# Patient Record
Sex: Female | Born: 1975 | Race: White | Hispanic: No | Smoking: Current every day smoker
Health system: Southern US, Community
[De-identification: ages and names within clinical notes are randomized; demographics above are authoritative.]

## PROBLEM LIST (undated history)

## (undated) DIAGNOSIS — G8929 Other chronic pain: Secondary | ICD-10-CM

## (undated) DIAGNOSIS — G43909 Migraine, unspecified, not intractable, without status migrainosus: Secondary | ICD-10-CM

## (undated) DIAGNOSIS — J45909 Unspecified asthma, uncomplicated: Secondary | ICD-10-CM

## (undated) DIAGNOSIS — E039 Hypothyroidism, unspecified: Secondary | ICD-10-CM

## (undated) DIAGNOSIS — M549 Dorsalgia, unspecified: Secondary | ICD-10-CM

## (undated) HISTORY — DX: Hypothyroidism, unspecified: E03.9

## (undated) HISTORY — PX: TUBAL LIGATION: SHX77

---

## 2010-11-21 ENCOUNTER — Inpatient Hospital Stay (INDEPENDENT_AMBULATORY_CARE_PROVIDER_SITE_OTHER)
Admission: RE | Admit: 2010-11-21 | Discharge: 2010-11-21 | Disposition: A | Discharge status: Home / Self Care | Payer: Self-pay | Source: Ambulatory Visit | Attending: Family Medicine | Admitting: Family Medicine

## 2010-11-21 DIAGNOSIS — N760 Acute vaginitis: Secondary | ICD-10-CM

## 2010-11-21 DIAGNOSIS — A499 Bacterial infection, unspecified: Secondary | ICD-10-CM

## 2010-11-21 DIAGNOSIS — J45909 Unspecified asthma, uncomplicated: Secondary | ICD-10-CM

## 2010-11-21 LAB — POCT PREGNANCY, URINE: Preg Test, Ur: NEGATIVE

## 2010-11-21 LAB — POCT URINALYSIS DIP (DEVICE)
Hgb urine dipstick: NEGATIVE
Protein, ur: NEGATIVE mg/dL
Specific Gravity, Urine: 1.015 (ref 1.005–1.030)
Urobilinogen, UA: 0.2 mg/dL (ref 0.0–1.0)

## 2010-11-21 LAB — WET PREP, GENITAL

## 2010-11-22 LAB — GC/CHLAMYDIA PROBE AMP, GENITAL
Chlamydia, DNA Probe: NEGATIVE
GC Probe Amp, Genital: NEGATIVE

## 2013-07-10 ENCOUNTER — Emergency Department (HOSPITAL_COMMUNITY)
Admission: EM | Admit: 2013-07-10 | Discharge: 2013-07-10 | Disposition: A | Discharge status: Home / Self Care | Payer: Medicaid Other | Source: Home / Self Care

## 2013-07-10 ENCOUNTER — Encounter (HOSPITAL_COMMUNITY): Payer: Self-pay | Admitting: Emergency Medicine

## 2013-07-10 DIAGNOSIS — R52 Pain, unspecified: Secondary | ICD-10-CM

## 2013-07-10 DIAGNOSIS — S46911A Strain of unspecified muscle, fascia and tendon at shoulder and upper arm level, right arm, initial encounter: Secondary | ICD-10-CM

## 2013-07-10 DIAGNOSIS — M546 Pain in thoracic spine: Secondary | ICD-10-CM

## 2013-07-10 DIAGNOSIS — M545 Low back pain, unspecified: Secondary | ICD-10-CM

## 2013-07-10 DIAGNOSIS — G8929 Other chronic pain: Secondary | ICD-10-CM

## 2013-07-10 DIAGNOSIS — IMO0002 Reserved for concepts with insufficient information to code with codable children: Secondary | ICD-10-CM

## 2013-07-10 HISTORY — DX: Unspecified asthma, uncomplicated: J45.909

## 2013-07-10 HISTORY — DX: Migraine, unspecified, not intractable, without status migrainosus: G43.909

## 2013-07-10 MED ORDER — CYCLOBENZAPRINE HCL 5 MG PO TABS
5.0000 mg | ORAL_TABLET | Freq: Three times a day (TID) | ORAL | Status: DC | PRN
Start: 2013-07-10 — End: 2017-09-24

## 2013-07-10 MED ORDER — TRAMADOL HCL 50 MG PO TABS
50.0000 mg | ORAL_TABLET | Freq: Four times a day (QID) | ORAL | Status: DC | PRN
Start: 2013-07-10 — End: 2017-09-24

## 2013-07-10 MED ORDER — DICLOFENAC POTASSIUM 50 MG PO TABS: 50.0000 mg | ORAL_TABLET | Freq: Three times a day (TID) | ORAL | Status: DC

## 2013-07-10 NOTE — ED Provider Notes (Signed)
Medical screening examination/treatment/procedure(s) were performed by resident physician or non-physician practitioner and as supervising physician I was immediately available for consultation/collaboration.   Tyren Dugar DOUGLAS MD.   Tricia Pledger D Reeve Mallo, MD 07/10/13 1314 

## 2013-07-10 NOTE — ED Provider Notes (Signed)
CSN: [id number]     Arrival date & time 07/10/13  0818 History   First MD Initiated Contact with Patient 07/10/13 [phone]     Chief Complaint  Patient presents with  . Back Pain   (Consider location/radiation/quality/duration/timing/severity/associated sxs/prior Treatment) HPI Comments: 38 year old female complaining of acute on chronic back pain. States she has had low back pain since 38 years old. Apparently she had a type of injury to her spine early in life and was placed on narcotics for a period of time. She has not seen a physician for several years. She states in the past 3-4 days she twisted "wrong" and caused pain in the right parathoracic musculature along the medial scapula. She is also complaining of worsening pain along the lower lumbosacral musculature. She states rarely the pain will radiate down the right leg. The case is chronic as well. Denies focal motor weakness or sensory loss.   Past Medical History  Diagnosis Date  . Asthma   . Migraine headache    Past Surgical History  Procedure Laterality Date  . Tubal ligation     History reviewed. No pertinent family history. History  Substance Use Topics  . Smoking status: Current Every Day Smoker  . Smokeless tobacco: Not on file  . Alcohol Use: No   OB History   Grav Para Term Preterm Abortions TAB SAB Ect Mult Living                 Review of Systems  Constitutional: Positive for activity change. Negative for fever and chills.  HENT: Negative.   Respiratory: Negative.   Cardiovascular: Negative.   Musculoskeletal: Positive for back pain and myalgias.       As per HPI  Skin: Negative for color change, pallor and rash.  Neurological: Negative.     Allergies  Review of patient's allergies indicates no known allergies.  Home Medications   Current Outpatient Rx  Name  Route  Sig  Dispense  Refill  . amoxicillin (AMOXIL) 500 MG capsule   Oral   Take 500 mg by mouth 3 (three) times daily.         .  cyclobenzaprine (FLEXERIL) 5 MG tablet   Oral   Take 1 tablet (5 mg total) by mouth 3 (three) times daily as needed for muscle spasms.   20 tablet   0   . diclofenac (CATAFLAM) 50 MG tablet   Oral   Take 1 tablet (50 mg total) by mouth 3 (three) times daily.   21 tablet   0   . traMADol (ULTRAM) 50 MG tablet   Oral   Take 1 tablet (50 mg total) by mouth every 6 (six) hours as needed.   20 tablet   0    BP 124/84  Pulse 72  Temp(Src) 98.7 F (37.1 C) (Oral)  Resp 18  SpO2 98% Physical Exam  Nursing note and vitals reviewed. Constitutional: She is oriented to person, place, and time. She appears well-developed and well-nourished. No distress.  HENT:  Head: Normocephalic and atraumatic.  Eyes: EOM are normal. Pupils are equal, round, and reactive to light.  Neck: Normal range of motion. Neck supple.  Pulmonary/Chest: Effort normal. No respiratory distress.  Musculoskeletal: She exhibits tenderness. She exhibits no edema.  Tenderness without swelling or deformity to the right parathoracic/medial scapular musculature. Result of tenderness along the lumbosacral stretcher. Again, no swelling, discoloration or deformity. Distal neurovascular motor sensory is intact. Normal gait.  Lymphadenopathy:    She has  no cervical adenopathy.  Neurological: She is alert and oriented to person, place, and time. No cranial nerve deficit. She exhibits normal muscle tone.  Skin: Skin is warm and dry.  Psychiatric: She has a normal mood and affect.    ED Course  Procedures (including critical care time) Labs Review Labs Reviewed - No data to display Imaging Review No results found.    MDM   1. Acute exacerbation of chronic low back pain   2. Right-sided thoracic back pain   3. Muscle strain of right scapular region    Tramdol 50 mg Flexeril 5 mg Cataflam 50 MG  Ice to sore areas (St heat always makes it worse) Follow with your PCP ASAP     Hayden Rasmussen, NP 07/10/13 [phone]

## 2013-07-10 NOTE — ED Notes (Addendum)
Reported history of fractures in back many years ago w residual chronic pain . C/o flare up of pain since mid week, minimal relief w motrin and flexeril. On amoxicillin for reported dental issue

## 2013-07-10 NOTE — Discharge Instructions (Signed)
Back Pain, Adult Low back pain is very common. About 1 in 5 people have back pain.The cause of low back pain is rarely dangerous. The pain often gets better over time.About half of people with a sudden onset of back pain feel better in just 2 weeks. About 8 in 10 people feel better by 6 weeks.  CAUSES Some common causes of back pain include:  Strain of the muscles or ligaments supporting the spine.  Wear and tear (degeneration) of the spinal discs.  Arthritis.  Direct injury to the back. DIAGNOSIS Most of the time, the direct cause of low back pain is not known.However, back pain can be treated effectively even when the exact cause of the pain is unknown.Answering your caregiver's questions about your overall health and symptoms is one of the most accurate ways to make sure the cause of your pain is not dangerous. If your caregiver needs more information, he or she may order lab work or imaging tests (X-rays or MRIs).However, even if imaging tests show changes in your back, this usually does not require surgery. HOME CARE INSTRUCTIONS For many people, back pain returns.Since low back pain is rarely dangerous, it is often a condition that people can learn to [facility] their own.   Remain active. It is stressful on the back to sit or stand in one place. Do not sit, drive, or stand in one place for more than 30 minutes at a time. Take short walks on level surfaces as soon as pain allows.Try to increase the length of time you walk each day.  Do not stay in bed.Resting more than 1 or 2 days can delay your recovery.  Do not avoid exercise or work.Your body is made to move.It is not dangerous to be active, even though your back may hurt.Your back will likely heal faster if you return to being active before your pain is gone.  Pay attention to your body when you bend and lift. Many people have less discomfortwhen lifting if they bend their knees, keep the load close to their bodies,and  avoid twisting. Often, the most comfortable positions are those that put less stress on your recovering back.  Find a comfortable position to sleep. Use a firm mattress and lie on your side with your knees slightly bent. If you lie on your back, put a pillow under your knees.  Only take over-the-counter or prescription medicines as directed by your caregiver. Over-the-counter medicines to reduce pain and inflammation are often the most helpful.Your caregiver may prescribe muscle relaxant drugs.These medicines help dull your pain so you can more quickly return to your normal activities and healthy exercise.  Put ice on the injured area.  Put ice in a plastic bag.  Place a towel between your skin and the bag.  Leave the ice on for 15-20 minutes, 03-04 times a day for the first 2 to 3 days. After that, ice and heat may be alternated to reduce pain and spasms.  Ask your caregiver about trying back exercises and gentle massage. This may be of some benefit.  Avoid feeling anxious or stressed.Stress increases muscle tension and can worsen back pain.It is important to recognize when you are anxious or stressed and learn ways to manage it.Exercise is a great option. SEEK MEDICAL CARE IF:  You have pain that is not relieved with rest or medicine.  You have pain that does not improve in 1 week.  You have new symptoms.  You are generally not feeling well. SEEK  IMMEDIATE MEDICAL CARE IF:   You have pain that radiates from your back into your legs.  You develop new bowel or bladder control problems.  You have unusual weakness or numbness in your arms or legs.  You develop nausea or vomiting.  You develop abdominal pain.  You feel faint. Document Released: 06/04/2005 Document Revised: 12/04/2011 Document Reviewed: 10/23/2010 [facility] Patient Information 2014 [place], [place].  Chronic Back Pain  When back pain lasts longer than 3 months, it is called chronic back pain.People with  chronic back pain often go through certain periods that are more intense (flare-ups).  CAUSES Chronic back pain can be caused by wear and tear (degeneration) on different structures in your back. These structures include:  The bones of your spine (vertebrae) and the joints surrounding your spinal cord and nerve roots (facets).  The strong, fibrous tissues that connect your vertebrae (ligaments). Degeneration of these structures may result in pressure on your nerves. This can lead to constant pain. HOME CARE INSTRUCTIONS  Avoid bending, heavy lifting, prolonged sitting, and activities which make the problem worse.  Take brief periods of rest throughout the day to reduce your pain. Lying down or standing usually is better than sitting while you are resting.  Take over-the-counter or prescription medicines only as directed by your caregiver. SEEK IMMEDIATE MEDICAL CARE IF:   You have weakness or numbness in one of your legs or feet.  You have trouble controlling your bladder or bowels.  You have nausea, vomiting, abdominal pain, shortness of breath, or fainting. Document Released: 07/12/2004 Document Revised: 08/27/2011 Document Reviewed: 05/19/2011 [facility] Patient Information 2014 [place], [place].  Lumbosacral Strain Lumbosacral strain is a strain of any of the parts that make up your lumbosacral vertebrae. Your lumbosacral vertebrae are the bones that make up the lower third of your backbone. Your lumbosacral vertebrae are held together by muscles and tough, fibrous tissue (ligaments).  CAUSES  A sudden blow to your back can cause lumbosacral strain. Also, anything that causes an excessive stretch of the muscles in the low back can cause this strain. This is typically seen when people exert themselves strenuously, fall, lift heavy objects, bend, or crouch repeatedly. RISK FACTORS  Physically demanding work.  Participation in pushing or pulling sports or sports that require sudden  twist of the back (tennis, golf, baseball).  Weight lifting.  Excessive lower back curvature.  Forward-tilted pelvis.  Weak back or abdominal muscles or both.  Tight hamstrings. SIGNS AND SYMPTOMS  Lumbosacral strain may cause pain in the area of your injury or pain that moves (radiates) down your leg.  DIAGNOSIS Your health care provider can often diagnose lumbosacral strain through a physical exam. In some cases, you may need tests such as X-ray exams.  TREATMENT  Treatment for your lower back injury depends on many factors that your clinician will have to evaluate. However, most treatment will include the use of anti-inflammatory medicines. HOME CARE INSTRUCTIONS   Avoid hard physical activities (tennis, racquetball, waterskiing) if you are not in proper physical condition for it. This may aggravate or create problems.  If you have a back problem, avoid sports requiring sudden body movements. Swimming and walking are generally safer activities.  Maintain good posture.  Maintain a healthy weight.  For acute conditions, you may put ice on the injured area.  Put ice in a plastic bag.  Place a towel between your skin and the bag.  Leave the ice on for 20 minutes, 2 3 times a day.  When the low back starts healing, stretching and strengthening exercises may be recommended. SEEK MEDICAL CARE IF:  Your back pain is getting worse.  You experience severe back pain not relieved with medicines. SEEK IMMEDIATE MEDICAL CARE IF:   You have numbness, tingling, weakness, or problems with the use of your arms or legs.  There is a change in bowel or bladder control.  You have increasing pain in any area of the body, including your belly (abdomen).  You notice shortness of breath, dizziness, or feel faint.  You feel sick to your stomach (nauseous), are throwing up (vomiting), or become sweaty.  You notice discoloration of your toes or legs, or your feet get very cold. MAKE SURE  YOU:   Understand these instructions.  Will watch your condition.  Will get help right away if you are not doing well or get worse. Document Released: 03/14/2005 Document Revised: 03/25/2013 Document Reviewed: 01/21/2013 [facility] Patient Information 2014 [place], [place].  Muscle Strain A muscle strain is an injury that occurs when a muscle is stretched beyond its normal length. Usually a small number of muscle fibers are torn when this happens. Muscle strain is rated in degrees. First-degree strains have the least amount of muscle fiber tearing and pain. Second-degree and third-degree strains have increasingly more tearing and pain.  Usually, recovery from muscle strain takes 1 2 weeks. Complete healing takes 5 6 weeks.  CAUSES  Muscle strain happens when a sudden, violent force placed on a muscle stretches it too far. This may occur with lifting, sports, or a fall.  RISK FACTORS Muscle strain is especially common in athletes.  SIGNS AND SYMPTOMS At the site of the muscle strain, there may be:  Pain.  Bruising.  Swelling.  Difficulty using the muscle due to pain or lack of normal function. DIAGNOSIS  Your health care provider will perform a physical exam and ask about your medical history. TREATMENT  Often, the best treatment for a muscle strain is resting, icing, and applying cold compresses to the injured area.  HOME CARE INSTRUCTIONS   Use the PRICE method of treatment to promote muscle healing during the first 2 3 days after your injury. The PRICE method involves:  Protecting the muscle from being injured again.  Restricting your activity and resting the injured body part.  Icing your injury. To do this, put ice in a plastic bag. Place a towel between your skin and the bag. Then, apply the ice and leave it on from 15 20 minutes each hour. After the third day, switch to moist heat packs.  Apply compression to the injured area with a splint or elastic bandage. Be careful  not to wrap it too tightly. This may interfere with blood circulation or increase swelling.  Elevate the injured body part above the level of your heart as often as you can.  Only take over-the-counter or prescription medicines for pain, discomfort, or fever as directed by your health care provider.  Warming up prior to exercise helps to prevent future muscle strains. SEEK MEDICAL CARE IF:   You have increasing pain or swelling in the injured area.  You have numbness, tingling, or a significant loss of strength in the injured area. MAKE SURE YOU:   Understand these instructions.  Will watch your condition.  Will get help right away if you are not doing well or get worse. Document Released: 06/04/2005 Document Revised: 03/25/2013 Document Reviewed: 01/01/2013 [facility] Patient Information 2014 [place], [place].  Back Exercises Back exercises  help treat and prevent back injuries. The goal is to increase your strength in your belly (abdominal) and back muscles. These exercises can also help with flexibility. Start these exercises when told by your doctor. HOME CARE Back exercises include: Pelvic Tilt.  Lie on your back with your knees bent. Tilt your pelvis until the lower part of your back is against the floor. Hold this position 5 to 10 sec. Repeat this exercise 5 to 10 times. Knee to Chest.  Pull 1 knee up against your chest and hold for 20 to 30 seconds. Repeat this with the other knee. This may be done with the other leg straight or bent, whichever feels better. Then, pull both knees up against your chest. Sit-Ups or Curl-Ups.  Bend your knees 90 degrees. Start with tilting your pelvis, and do a partial, slow sit-up. Only lift your upper half 30 to 45 degrees off the floor. Take at least 2 to 3 seonds for each sit-up. Do not do sit-ups with your knees out straight. If partial sit-ups are difficult, simply do the above but with only tightening your belly (abdominal) muscles and  holding it as told. Hip-Lift.  Lie on your back with your knees flexed 90 degrees. Push down with your feet and shoulders as you raise your hips 2 inches off the floor. Hold for 10 seconds, repeat 5 to 10 times. Back Arches.  Lie on your stomach. Prop yourself up on bent elbows. Slowly press on your hands, causing an arch in your low back. Repeat 3 to 5 times. Shoulder-Lifts.  Lie face down with arms beside your body. Keep hips and belly pressed to floor as you slowly lift your head and shoulders off the floor. Do not overdo your exercises. Be careful in the beginning. Exercises may cause you some mild back discomfort. If the pain lasts for more than 15 minutes, stop the exercises until you see your doctor. Improvement with exercise for back problems is slow.  Document Released: 07/07/2010 Document Revised: 08/27/2011 Document Reviewed: 04/05/2011 [facility] Patient Information 2014 [place], [place].

## 2014-06-30 ENCOUNTER — Other Ambulatory Visit (HOSPITAL_COMMUNITY): Payer: Self-pay | Admitting: Family Medicine

## 2014-06-30 DIAGNOSIS — E049 Nontoxic goiter, unspecified: Secondary | ICD-10-CM

## 2014-07-02 ENCOUNTER — Ambulatory Visit (HOSPITAL_COMMUNITY): Payer: Medicaid Other

## 2014-07-07 ENCOUNTER — Ambulatory Visit (HOSPITAL_COMMUNITY): Payer: Medicaid Other

## 2014-07-08 ENCOUNTER — Ambulatory Visit (HOSPITAL_COMMUNITY): Admission: RE | Admit: 2014-07-08 | Payer: Medicaid Other | Source: Ambulatory Visit

## 2014-07-14 ENCOUNTER — Ambulatory Visit (HOSPITAL_COMMUNITY): Admission: RE | Admit: 2014-07-14 | Payer: Medicaid Other | Source: Ambulatory Visit

## 2014-07-15 ENCOUNTER — Ambulatory Visit (HOSPITAL_COMMUNITY)
Admission: RE | Admit: 2014-07-15 | Discharge: 2014-07-15 | Disposition: A | Discharge status: Home / Self Care | Payer: Medicaid Other | Source: Ambulatory Visit | Attending: Family Medicine | Admitting: Family Medicine

## 2014-07-15 DIAGNOSIS — E049 Nontoxic goiter, unspecified: Secondary | ICD-10-CM | POA: Insufficient documentation

## 2014-08-10 ENCOUNTER — Encounter (HOSPITAL_COMMUNITY): Payer: Self-pay | Admitting: *Deleted

## 2014-08-10 ENCOUNTER — Emergency Department (HOSPITAL_COMMUNITY)
Admission: EM | Admit: 2014-08-10 | Discharge: 2014-08-11 | Disposition: A | Discharge status: Home / Self Care | Payer: Medicaid Other | Attending: Emergency Medicine | Admitting: Emergency Medicine

## 2014-08-10 DIAGNOSIS — Y998 Other external cause status: Secondary | ICD-10-CM | POA: Diagnosis not present

## 2014-08-10 DIAGNOSIS — N390 Urinary tract infection, site not specified: Secondary | ICD-10-CM

## 2014-08-10 DIAGNOSIS — Z720 Tobacco use: Secondary | ICD-10-CM | POA: Insufficient documentation

## 2014-08-10 DIAGNOSIS — B9689 Other specified bacterial agents as the cause of diseases classified elsewhere: Secondary | ICD-10-CM

## 2014-08-10 DIAGNOSIS — Z880 Allergy status to penicillin: Secondary | ICD-10-CM | POA: Insufficient documentation

## 2014-08-10 DIAGNOSIS — Y9389 Activity, other specified: Secondary | ICD-10-CM | POA: Diagnosis not present

## 2014-08-10 DIAGNOSIS — S8011XA Contusion of right lower leg, initial encounter: Secondary | ICD-10-CM | POA: Insufficient documentation

## 2014-08-10 DIAGNOSIS — S3991XA Unspecified injury of abdomen, initial encounter: Secondary | ICD-10-CM | POA: Insufficient documentation

## 2014-08-10 DIAGNOSIS — J45909 Unspecified asthma, uncomplicated: Secondary | ICD-10-CM | POA: Diagnosis not present

## 2014-08-10 DIAGNOSIS — Z3202 Encounter for pregnancy test, result negative: Secondary | ICD-10-CM | POA: Insufficient documentation

## 2014-08-10 DIAGNOSIS — S7011XA Contusion of right thigh, initial encounter: Secondary | ICD-10-CM | POA: Diagnosis not present

## 2014-08-10 DIAGNOSIS — R509 Fever, unspecified: Secondary | ICD-10-CM | POA: Insufficient documentation

## 2014-08-10 DIAGNOSIS — W01198A Fall on same level from slipping, tripping and stumbling with subsequent striking against other object, initial encounter: Secondary | ICD-10-CM | POA: Insufficient documentation

## 2014-08-10 DIAGNOSIS — G43909 Migraine, unspecified, not intractable, without status migrainosus: Secondary | ICD-10-CM | POA: Diagnosis not present

## 2014-08-10 DIAGNOSIS — N760 Acute vaginitis: Secondary | ICD-10-CM | POA: Insufficient documentation

## 2014-08-10 DIAGNOSIS — Z792 Long term (current) use of antibiotics: Secondary | ICD-10-CM | POA: Diagnosis not present

## 2014-08-10 DIAGNOSIS — Z79899 Other long term (current) drug therapy: Secondary | ICD-10-CM | POA: Insufficient documentation

## 2014-08-10 DIAGNOSIS — Z791 Long term (current) use of non-steroidal anti-inflammatories (NSAID): Secondary | ICD-10-CM | POA: Diagnosis not present

## 2014-08-10 DIAGNOSIS — Y92092 Bedroom in other non-institutional residence as the place of occurrence of the external cause: Secondary | ICD-10-CM | POA: Insufficient documentation

## 2014-08-10 LAB — CBC WITH DIFFERENTIAL/PLATELET
Basophils Absolute: 0 10*3/uL (ref 0.0–0.1)
Basophils Relative: 0 % (ref 0–1)
EOS PCT: 0 % (ref 0–5)
Eosinophils Absolute: 0 10*3/uL (ref 0.0–0.7)
HEMATOCRIT: 39.9 % (ref 36.0–46.0)
HEMOGLOBIN: 12.5 g/dL (ref 12.0–15.0)
LYMPHS ABS: 2 10*3/uL (ref 0.7–4.0)
LYMPHS PCT: 16 % (ref 12–46)
MCH: 26.3 pg (ref 26.0–34.0)
MCHC: 31.3 g/dL (ref 30.0–36.0)
MCV: 84 fL (ref 78.0–100.0)
MONO ABS: 0.8 10*3/uL (ref 0.1–1.0)
MONOS PCT: 6 % (ref 3–12)
NEUTROS ABS: 10 10*3/uL — AB (ref 1.7–7.7)
Neutrophils Relative %: 78 % — ABNORMAL HIGH (ref 43–77)
Platelets: 243 10*3/uL (ref 150–400)
RBC: 4.75 MIL/uL (ref 3.87–5.11)
RDW: 15.2 % (ref 11.5–15.5)
WBC: 12.8 10*3/uL — AB (ref 4.0–10.5)

## 2014-08-10 LAB — BASIC METABOLIC PANEL
Anion gap: 5 (ref 5–15)
BUN: 14 mg/dL (ref 6–23)
CALCIUM: 9.2 mg/dL (ref 8.4–10.5)
CO2: 26 mmol/L (ref 19–32)
CREATININE: 1.06 mg/dL (ref 0.50–1.10)
Chloride: 107 mmol/L (ref 96–112)
GFR calc Af Amer: 76 mL/min — ABNORMAL LOW (ref >90)
GFR, EST NON AFRICAN AMERICAN: 66 mL/min — AB (ref >90)
GLUCOSE: 92 mg/dL (ref 70–99)
POTASSIUM: 4.3 mmol/L (ref 3.5–5.1)
Sodium: 138 mmol/L (ref 135–145)

## 2014-08-10 MED ORDER — OXYCODONE-ACETAMINOPHEN 5-325 MG PO TABS
1.0000 | ORAL_TABLET | Freq: Once | ORAL | Status: AC
  Administered 2014-08-11: 1 via ORAL
  Filled 2014-08-10: qty 1

## 2014-08-10 NOTE — ED Provider Notes (Signed)
CSN: [id number]     Arrival date & time 08/10/14  2137 History   First MD Initiated Contact with Patient 08/10/14 2302     Chief Complaint  Patient presents with  . Abdominal Pain     (Consider location/radiation/quality/duration/timing/severity/associated sxs/prior Treatment) Patient is a 39 y.o. female presenting with abdominal pain. The history is provided by the patient.  Abdominal Pain Pain radiates to:  Suprapubic region Pain severity:  Severe Onset quality:  Gradual Duration:  5 days Timing:  Constant Progression:  Worsening Chronicity:  New Relieved by:  Nothing Associated symptoms: chills, dysuria, fever, nausea and vaginal discharge   Associated symptoms: no vaginal bleeding    Wanda Montgomery is a 39 y.o. female who presents to the ED with abdominal pain that started 5 days ago with UTI symptoms. She has been taking OTC medication without relief. Yesterday she reports falling in her bedroom through a hole in the floor. She reports that she hit her right and her entire right side. Since then the pain in her stomach has increased. No sure if from the fall or UTI.  She has been taking ibuprofen without relief.   Past Medical History  Diagnosis Date  . Asthma   . Migraine headache    Past Surgical History  Procedure Laterality Date  . Tubal ligation     History reviewed. No pertinent family history. History  Substance Use Topics  . Smoking status: Current Every Day Smoker  . Smokeless tobacco: Not on file  . Alcohol Use: No   OB History    No data available     Review of Systems  Constitutional: Positive for fever and chills.  Gastrointestinal: Positive for nausea and abdominal pain.  Genitourinary: Positive for dysuria and vaginal discharge. Negative for vaginal bleeding.      Allergies  Amoxicillin  Home Medications   Prior to Admission medications   Medication Sig Start Date End Date Taking? Authorizing Provider  amoxicillin (AMOXIL) 500 MG  capsule Take 500 mg by mouth 3 (three) times daily.    Historical Provider, MD  cephALEXin (KEFLEX) 500 MG capsule Take 1 capsule (500 mg total) by mouth 4 (four) times daily. 08/11/14   Tailor Westfall Orlene Och, NP  cyclobenzaprine (FLEXERIL) 5 MG tablet Take 1 tablet (5 mg total) by mouth 3 (three) times daily as needed for muscle spasms. 07/10/13   Hayden Rasmussen, NP  diclofenac (CATAFLAM) 50 MG tablet Take 1 tablet (50 mg total) by mouth 3 (three) times daily. 07/10/13   Hayden Rasmussen, NP  HYDROcodone-acetaminophen (NORCO/VICODIN) 5-325 MG per tablet Take 1 tablet by mouth every 4 (four) hours as needed. 08/11/14   Lasonia Casino Orlene Och, NP  metroNIDAZOLE (FLAGYL) 500 MG tablet Take 1 tablet (500 mg total) by mouth 2 (two) times daily. 08/11/14   Exander Shaul Orlene Och, NP  phenazopyridine (PYRIDIUM) 200 MG tablet Take 1 tablet (200 mg total) by mouth 3 (three) times daily. 08/11/14   Marnisha Stampley Orlene Och, NP  traMADol (ULTRAM) 50 MG tablet Take 1 tablet (50 mg total) by mouth every 6 (six) hours as needed. 07/10/13   Hayden Rasmussen, NP   BP 119/65 mmHg  Pulse 80  Temp(Src) 98.2 F (36.8 C) (Oral)  Resp 18  Ht  (1.6 m)  Wt 214 lb (97.07 kg)  BMI 37.92 kg/m2  SpO2 100%  LMP 07/25/2014 Physical Exam  Constitutional: She is oriented to person, place, and time. She appears well-developed and well-nourished.  HENT:  Head: Normocephalic.  Eyes: EOM  are normal.  Neck: Neck supple.  Cardiovascular: Normal rate.   Pulmonary/Chest: Effort normal.  Abdominal: Soft. Bowel sounds are normal. There is tenderness in the suprapubic area. There is no rebound and no CVA tenderness.  Genitourinary:  External genitalia without lesions, yellow d/c vaginal vault. Mild CMT, no adnexal tenderness or mass palpated.   Musculoskeletal: Normal range of motion. She exhibits no edema.       Legs: Ecchymosis noted to right lower leg, right upper leg s/p fall.  Pedal pulses equal, adequate circulation, good touch sensation. Ambulatory with steady gait.     Neurological: She is alert and oriented to person, place, and time. She has normal strength. No cranial nerve deficit or sensory deficit. Gait normal.  Reflex Scores:      Bicep reflexes are 2+ on the right side and 2+ on the left side.      Brachioradialis reflexes are 2+ on the right side and 2+ on the left side.      Patellar reflexes are 2+ on the right side and 2+ on the left side. Skin: Skin is warm and dry.  Psychiatric: She has a normal mood and affect. Her behavior is normal.  Nursing note and vitals reviewed.   ED Course  Procedures (including critical care time) Results for orders placed or performed during the hospital encounter of 08/10/14 (from the past 24 hour(s))  CBC with Differential     Status: Abnormal   Collection Time: 08/10/14 10:55 PM  Result Value Ref Range   WBC 12.8 (H) 4.0 - 10.5 K/uL   RBC 4.75 3.87 - 5.11 MIL/uL   Hemoglobin 12.5 12.0 - 15.0 g/dL   HCT 16.1 09.6 - 04.5 %   MCV 84.0 78.0 - 100.0 fL   MCH 26.3 26.0 - 34.0 pg   MCHC 31.3 30.0 - 36.0 g/dL   RDW 40.9 81.1 - 91.4 %   Platelets 243 150 - 400 K/uL   Neutrophils Relative % 78 (H) 43 - 77 %   Neutro Abs 10.0 (H) 1.7 - 7.7 K/uL   Lymphocytes Relative 16 12 - 46 %   Lymphs Abs 2.0 0.7 - 4.0 K/uL   Monocytes Relative 6 3 - 12 %   Monocytes Absolute 0.8 0.1 - 1.0 K/uL   Eosinophils Relative 0 0 - 5 %   Eosinophils Absolute 0.0 0.0 - 0.7 K/uL   Basophils Relative 0 0 - 1 %   Basophils Absolute 0.0 0.0 - 0.1 K/uL  Basic metabolic panel     Status: Abnormal   Collection Time: 08/10/14 10:55 PM  Result Value Ref Range   Sodium 138 135 - 145 mmol/L   Potassium 4.3 3.5 - 5.1 mmol/L   Chloride 107 96 - 112 mmol/L   CO2 26 19 - 32 mmol/L   Glucose, Bld 92 70 - 99 mg/dL   BUN 14 6 - 23 mg/dL   Creatinine, Ser 7.82 0.50 - 1.10 mg/dL   Calcium 9.2 8.4 - 95.6 mg/dL   GFR calc non Af Amer 66 (L) >90 mL/min   GFR calc Af Amer 76 (L) >90 mL/min   Anion gap 5 5 - 15  Pregnancy, urine     Status:  None   Collection Time: 08/10/14 11:47 PM  Result Value Ref Range   Preg Test, Ur NEGATIVE NEGATIVE  Urinalysis, Routine w reflex microscopic     Status: Abnormal   Collection Time: 08/10/14 11:47 PM  Result Value Ref Range   Color, Urine  YELLOW YELLOW   APPearance HAZY (A) CLEAR   Specific Gravity, Urine 1.010 1.005 - 1.030   pH 5.5 5.0 - 8.0   Glucose, UA NEGATIVE NEGATIVE mg/dL   Hgb urine dipstick LARGE (A) NEGATIVE   Bilirubin Urine NEGATIVE NEGATIVE   Ketones, ur NEGATIVE NEGATIVE mg/dL   Protein, ur 30 (A) NEGATIVE mg/dL   Urobilinogen, UA 0.2 0.0 - 1.0 mg/dL   Nitrite POSITIVE (A) NEGATIVE   Leukocytes, UA LARGE (A) NEGATIVE  Urine microscopic-add on     Status: Abnormal   Collection Time: 08/10/14 11:47 PM  Result Value Ref Range   Squamous Epithelial / LPF RARE RARE   WBC, UA TOO NUMEROUS TO COUNT <3 WBC/hpf   RBC / HPF 3-6 <3 RBC/hpf   Bacteria, UA MANY (A) RARE  Wet prep, genital     Status: Abnormal   Collection Time: 08/11/14 12:45 AM  Result Value Ref Range   Yeast Wet Prep HPF POC NONE SEEN NONE SEEN   Trich, Wet Prep NONE SEEN NONE SEEN   Clue Cells Wet Prep HPF POC MANY (A) NONE SEEN   WBC, Wet Prep HPF POC MANY (A) NONE SEEN    MDM  39 y.o. female with lower abdominal pain and UTI symptoms and right lower extremity pain s/p fall. Will treat for UTI and for contusions. Stable for d/c without fever,n/v or CVA tenderness. Rocephin 1 gram IM prior to d/c.  Final diagnoses:  UTI (lower urinary tract infection)  Multiple leg contusions, right, initial encounter  Bacterial vaginitis      Janne NapoleonHope M Faithann Natal, NP 08/11/14 0129  Loren Raceravid Yelverton, MD 08/11/14 [phone]

## 2014-08-10 NOTE — ED Notes (Signed)
Low abd pain, dysuria, No NVD.  Stepped in hole and injured rt leg yesterday.

## 2014-08-11 LAB — URINALYSIS, ROUTINE W REFLEX MICROSCOPIC
Bilirubin Urine: NEGATIVE
GLUCOSE, UA: NEGATIVE mg/dL
KETONES UR: NEGATIVE mg/dL
NITRITE: POSITIVE — AB
PROTEIN: 30 mg/dL — AB
Specific Gravity, Urine: 1.01 (ref 1.005–1.030)
UROBILINOGEN UA: 0.2 mg/dL (ref 0.0–1.0)
pH: 5.5 (ref 5.0–8.0)

## 2014-08-11 LAB — WET PREP, GENITAL
Trich, Wet Prep: NONE SEEN
Yeast Wet Prep HPF POC: NONE SEEN

## 2014-08-11 LAB — URINE MICROSCOPIC-ADD ON

## 2014-08-11 LAB — PREGNANCY, URINE: PREG TEST UR: NEGATIVE

## 2014-08-11 MED ORDER — LIDOCAINE HCL (PF) 1 % IJ SOLN
INTRAMUSCULAR | Status: AC
  Filled 2014-08-11: qty 5

## 2014-08-11 MED ORDER — KETOROLAC TROMETHAMINE 60 MG/2ML IM SOLN
60.0000 mg | Freq: Once | INTRAMUSCULAR | Status: AC
  Administered 2014-08-11: 60 mg via INTRAMUSCULAR
  Filled 2014-08-11: qty 2

## 2014-08-11 MED ORDER — CEPHALEXIN 500 MG PO CAPS: 500.0000 mg | ORAL_CAPSULE | Freq: Four times a day (QID) | ORAL | Status: DC

## 2014-08-11 MED ORDER — PHENAZOPYRIDINE HCL 200 MG PO TABS: 200.0000 mg | ORAL_TABLET | Freq: Three times a day (TID) | ORAL | Status: DC

## 2014-08-11 MED ORDER — HYDROCODONE-ACETAMINOPHEN 5-325 MG PO TABS: 1.0000 | ORAL_TABLET | ORAL | Status: DC | PRN

## 2014-08-11 MED ORDER — METRONIDAZOLE 500 MG PO TABS: 500.0000 mg | ORAL_TABLET | Freq: Two times a day (BID) | ORAL | Status: DC

## 2014-08-11 MED ORDER — CEFTRIAXONE SODIUM 1 G IJ SOLR
1.0000 g | Freq: Once | INTRAMUSCULAR | Status: AC
  Administered 2014-08-11: 1 g via INTRAMUSCULAR
  Filled 2014-08-11: qty 10

## 2014-08-11 NOTE — ED Notes (Signed)
Pt alert & oriented x4, stable gait. Patient given discharge instructions, paperwork & prescription(s). Patient informed not to drive, operate any equipment & handel any important documents 4 hours after taking pain medication. Patient  instructed to stop at the registration desk to finish any additional paperwork. Patient  verbalized understanding. Pt left department w/ no further questions. 

## 2014-08-11 NOTE — ED Notes (Signed)
Patient states that the pain medication has not touched her pain. States now that her pain is not only when she goes to restroom but just in general. RN aware that she is asking for more pain medication at this time.

## 2014-08-12 LAB — GC/CHLAMYDIA PROBE AMP (~~LOC~~) NOT AT ARMC
Chlamydia: NEGATIVE
NEISSERIA GONORRHEA: NEGATIVE

## 2014-09-28 ENCOUNTER — Other Ambulatory Visit (HOSPITAL_COMMUNITY): Payer: Self-pay | Admitting: Family Medicine

## 2014-09-28 DIAGNOSIS — R102 Pelvic and perineal pain: Secondary | ICD-10-CM

## 2014-10-01 ENCOUNTER — Other Ambulatory Visit (HOSPITAL_COMMUNITY): Payer: Self-pay | Admitting: Family Medicine

## 2014-10-01 DIAGNOSIS — R102 Pelvic and perineal pain: Secondary | ICD-10-CM

## 2014-10-04 ENCOUNTER — Other Ambulatory Visit (HOSPITAL_COMMUNITY): Payer: Medicaid Other

## 2014-10-06 ENCOUNTER — Ambulatory Visit (HOSPITAL_COMMUNITY): Admission: RE | Admit: 2014-10-06 | Payer: Medicaid Other | Source: Ambulatory Visit

## 2014-10-06 ENCOUNTER — Other Ambulatory Visit (HOSPITAL_COMMUNITY): Payer: Medicaid Other

## 2014-10-07 ENCOUNTER — Ambulatory Visit (HOSPITAL_COMMUNITY): Admission: RE | Admit: 2014-10-07 | Payer: Medicaid Other | Source: Ambulatory Visit

## 2014-10-15 ENCOUNTER — Other Ambulatory Visit (HOSPITAL_COMMUNITY): Payer: Medicaid Other

## 2014-10-15 ENCOUNTER — Ambulatory Visit (HOSPITAL_COMMUNITY)
Admission: RE | Admit: 2014-10-15 | Discharge: 2014-10-15 | Disposition: A | Discharge status: Home / Self Care | Payer: Medicaid Other | Source: Ambulatory Visit | Attending: Family Medicine | Admitting: Family Medicine

## 2014-10-15 DIAGNOSIS — R102 Pelvic and perineal pain: Secondary | ICD-10-CM | POA: Diagnosis present

## 2016-05-14 ENCOUNTER — Encounter (HOSPITAL_COMMUNITY): Payer: Self-pay | Admitting: *Deleted

## 2016-05-14 ENCOUNTER — Emergency Department (HOSPITAL_COMMUNITY): Payer: Medicaid Other

## 2016-05-14 ENCOUNTER — Emergency Department (HOSPITAL_COMMUNITY)
Admission: EM | Admit: 2016-05-14 | Discharge: 2016-05-14 | Disposition: A | Discharge status: Home / Self Care | Payer: Medicaid Other | Attending: Emergency Medicine | Admitting: Emergency Medicine

## 2016-05-14 DIAGNOSIS — J45909 Unspecified asthma, uncomplicated: Secondary | ICD-10-CM | POA: Insufficient documentation

## 2016-05-14 DIAGNOSIS — F17210 Nicotine dependence, cigarettes, uncomplicated: Secondary | ICD-10-CM | POA: Insufficient documentation

## 2016-05-14 DIAGNOSIS — J069 Acute upper respiratory infection, unspecified: Secondary | ICD-10-CM

## 2016-05-14 HISTORY — DX: Dorsalgia, unspecified: M54.9

## 2016-05-14 HISTORY — DX: Other chronic pain: G89.29

## 2016-05-14 MED ORDER — AZITHROMYCIN 250 MG PO TABS: 250.0000 mg | ORAL_TABLET | Freq: Every day | ORAL | 0 refills | Status: DC

## 2016-05-14 MED ORDER — ALBUTEROL SULFATE (2.5 MG/3ML) 0.083% IN NEBU
5.0000 mg | INHALATION_SOLUTION | Freq: Once | RESPIRATORY_TRACT | Status: AC
  Administered 2016-05-14: 5 mg via RESPIRATORY_TRACT

## 2016-05-14 MED ORDER — ALBUTEROL SULFATE HFA 108 (90 BASE) MCG/ACT IN AERS
1.0000 | INHALATION_SPRAY | Freq: Once | RESPIRATORY_TRACT | Status: AC
  Administered 2016-05-14: 1 via RESPIRATORY_TRACT
  Filled 2016-05-14: qty 6.7

## 2016-05-14 MED ORDER — ALBUTEROL SULFATE (2.5 MG/3ML) 0.083% IN NEBU
INHALATION_SOLUTION | RESPIRATORY_TRACT | Status: AC
  Filled 2016-05-14: qty 6

## 2016-05-14 MED ORDER — ALBUTEROL SULFATE HFA 108 (90 BASE) MCG/ACT IN AERS: 2.0000 | INHALATION_SPRAY | Freq: Four times a day (QID) | RESPIRATORY_TRACT | 2 refills | Status: DC | PRN

## 2016-05-14 NOTE — ED Notes (Signed)
Patient transported to X-ray 

## 2016-05-14 NOTE — ED Triage Notes (Addendum)
Pt states sinus pressure, cough, upper respiratory congestion x 2 weeks. States wheezing.  Is out of inhalers b/c does not have insurance.

## 2016-05-14 NOTE — Discharge Instructions (Signed)
we think what you have is a viral syndrome - the treatment for which is symptomatic relief only, and your body will fight the infection off in a few days.  See your primary care doctor in 1 week if the symptoms dont improve.  Please return to the ER if your symptoms worsen; you have increased pain, fevers, chills, inability to keep any medications down, confusion.

## 2016-05-14 NOTE — ED Notes (Signed)
Pt request note for court today. Same given

## 2016-05-14 NOTE — ED Notes (Signed)
Pt states she understands instructions. Home stable with steady gait. All questionbs answered.

## 2016-05-14 NOTE — ED Provider Notes (Addendum)
[facility]-EMERGENCY DEPT Provider Note   CSN: [id number] Arrival date & time: 05/14/16  0900     History   Chief Complaint Chief Complaint  Patient presents with  . URI    HPI Wanda Montgomery is a 40 y.o. female.  HPI SUBJECTIVE:  Wanda Montgomery is a 40 y.o. female who complains of coryza, congestion, nasal blockage and post nasal drip for 5 days. She denies a history of shortness of breath and has a history of asthma. Patient admits to smoke cigarettes.   OBJECTIVE: She appears well, vital signs are as noted. Ears normal.  Throat and pharynx normal.  Neck supple. No adenopathy in the neck. Nose is congested. Sinuses non tender. The chest is clear, without wheezes or rales.  ASSESSMENT:  viral upper respiratory illness  PLAN: Symptomatic therapy suggested: push fluids, rest and return office visit prn if symptoms persist or worsen. Lack of antibiotic effectiveness discussed with her. Call or return to clinic prn if these symptoms worsen or fail to improve as anticipated.  Past Medical History:  Diagnosis Date  . Asthma   . Chronic back pain   . Migraine headache     There are no active problems to display for this patient.   Past Surgical History:  Procedure Laterality Date  . TUBAL LIGATION      OB History    No data available       Home Medications    Prior to Admission medications   Medication Sig Start Date End Date Taking? Authorizing Provider  albuterol (PROVENTIL HFA;VENTOLIN HFA) 108 (90 Base) MCG/ACT inhaler Inhale 2 puffs into the lungs every 6 (six) hours as needed for wheezing or shortness of breath. 05/14/16   Derwood KaplanAnkit Cordney Barstow, MD  amoxicillin (AMOXIL) 500 MG capsule Take 500 mg by mouth 3 (three) times daily.    Historical Provider, MD  azithromycin (ZITHROMAX) 250 MG tablet Take 1 tablet (250 mg total) by mouth daily. Take first 2 tablets together, then 1 every day until finished. 05/17/16   Derwood KaplanAnkit Almus Woodham, MD  cephALEXin (KEFLEX) 500 MG  capsule Take 1 capsule (500 mg total) by mouth 4 (four) times daily. Patient not taking: Reported on 05/14/2016 08/11/14   Janne NapoleonHope M Neese, NP  cyclobenzaprine (FLEXERIL) 5 MG tablet Take 1 tablet (5 mg total) by mouth 3 (three) times daily as needed for muscle spasms. Patient not taking: Reported on 05/14/2016 07/10/13   Hayden Rasmussenavid Mabe, NP  diclofenac (CATAFLAM) 50 MG tablet Take 1 tablet (50 mg total) by mouth 3 (three) times daily. Patient not taking: Reported on 05/14/2016 07/10/13   Hayden Rasmussenavid Mabe, NP  HYDROcodone-acetaminophen (NORCO/VICODIN) 5-325 MG per tablet Take 1 tablet by mouth every 4 (four) hours as needed. Patient not taking: Reported on 05/14/2016 08/11/14   Janne NapoleonHope M Neese, NP  metroNIDAZOLE (FLAGYL) 500 MG tablet Take 1 tablet (500 mg total) by mouth 2 (two) times daily. Patient not taking: Reported on 05/14/2016 08/11/14   Janne NapoleonHope M Neese, NP  phenazopyridine (PYRIDIUM) 200 MG tablet Take 1 tablet (200 mg total) by mouth 3 (three) times daily. Patient not taking: Reported on 05/14/2016 08/11/14   Janne NapoleonHope M Neese, NP  traMADol (ULTRAM) 50 MG tablet Take 1 tablet (50 mg total) by mouth every 6 (six) hours as needed. Patient not taking: Reported on 05/14/2016 07/10/13   Hayden Rasmussenavid Mabe, NP    Family History No family history on file.  Social History Social History  Substance Use Topics  . Smoking status: Current Every Day Smoker  Packs/day: 0.50    Types: Cigarettes  . Smokeless tobacco: Never Used  . Alcohol use Yes     Allergies   Amoxicillin   Review of Systems Review of Systems   Physical Exam Updated Vital Signs BP 126/92 (BP Location: Right Arm)   Pulse 75   Temp 98.1 F (36.7 C)   Resp 18   Ht 5\' 5"  (1.651 m)   Wt 223 lb (101.2 kg)   LMP 05/08/2016   SpO2 98%   BMI 37.11 kg/m   Physical Exam  Constitutional: She is oriented to person, place, and time. She appears well-developed and well-nourished.  HENT:  Head: Normocephalic and atraumatic.  Right Ear: External ear  normal.  Left Ear: External ear normal.  Nose: Nose normal.  Mouth/Throat: No oropharyngeal exudate.  Eyes: EOM are normal. Pupils are equal, round, and reactive to light.  Neck: Neck supple.  Cardiovascular: Normal rate, regular rhythm and normal heart sounds.   No murmur heard. Pulmonary/Chest: Effort normal. No respiratory distress. She has no wheezes.  Abdominal: Soft. She exhibits no distension. There is no tenderness. There is no rebound and no guarding.  Lymphadenopathy:    She has cervical adenopathy.  Neurological: She is alert and oriented to person, place, and time.  Skin: Skin is warm and dry.  Nursing note and vitals reviewed.    ED Treatments / Results  Labs (all labs ordered are listed, but only abnormal results are displayed) Labs Reviewed - No data to display  EKG  EKG Interpretation None       Radiology No results found.  Procedures Procedures (including critical care time)  Medications Ordered in ED Medications  albuterol (PROVENTIL) (2.5 MG/3ML) 0.083% nebulizer solution 5 mg (5 mg Nebulization Given 05/14/16 0936)  albuterol (PROVENTIL HFA;VENTOLIN HFA) 108 (90 Base) MCG/ACT inhaler 1 puff (1 puff Inhalation Given 05/14/16 1151)     Initial Impression / Assessment and Plan / ED Course  I have reviewed the triage vital signs and the nursing notes.  Pertinent labs & imaging results that were available during my care of the patient were reviewed by me and considered in my medical decision making (see chart for details).  Clinical Course    Pt comes in with wheezing. She was given a treatment prior to my evaluation and feels better. Her lungs are clear. Pt has  URI like symptoms. Might have had a small asthma flair up - although she never took any nebs - so I dont think she needs to be committed to steroids.  Antibiotics as wait and watch - as patient has no insurance. Strict ER return precautions have been discussed, and patient is agreeing  with the plan and is comfortable with the workup done and the recommendations from the ER.   Final Clinical Impressions(s) / ED Diagnoses   Final diagnoses:  Upper respiratory tract infection, unspecified type    New Prescriptions Discharge Medication List as of 05/14/2016 11:40 AM    START taking these medications   Details  albuterol (PROVENTIL HFA;VENTOLIN HFA) 108 (90 Base) MCG/ACT inhaler Inhale 2 puffs into the lungs every 6 (six) hours as needed for wheezing or shortness of breath., Starting Mon 05/14/2016, Print    azithromycin (ZITHROMAX) 250 MG tablet Take 1 tablet (250 mg total) by mouth daily. Take first 2 tablets together, then 1 every day until finished., Starting Thu 05/17/2016, Print         Derwood KaplanAnkit Ovie Cornelio, MD 05/14/16 1212    Derwood KaplanAnkit Bevely Hackbart,  MD 05/29/16 [id number]

## 2016-05-14 NOTE — ED Notes (Addendum)
Ice chips given to pt per Rhunette CroftMildred Banker(RN)

## 2016-10-10 ENCOUNTER — Encounter (HOSPITAL_COMMUNITY): Payer: Self-pay | Admitting: *Deleted

## 2016-10-10 ENCOUNTER — Emergency Department (HOSPITAL_COMMUNITY): Payer: Self-pay

## 2016-10-10 ENCOUNTER — Emergency Department (HOSPITAL_COMMUNITY)
Admission: EM | Admit: 2016-10-10 | Discharge: 2016-10-11 | Disposition: A | Discharge status: Home / Self Care | Payer: Self-pay | Attending: Emergency Medicine | Admitting: Emergency Medicine

## 2016-10-10 DIAGNOSIS — F17210 Nicotine dependence, cigarettes, uncomplicated: Secondary | ICD-10-CM | POA: Insufficient documentation

## 2016-10-10 DIAGNOSIS — J069 Acute upper respiratory infection, unspecified: Secondary | ICD-10-CM | POA: Insufficient documentation

## 2016-10-10 DIAGNOSIS — B9689 Other specified bacterial agents as the cause of diseases classified elsewhere: Secondary | ICD-10-CM

## 2016-10-10 DIAGNOSIS — J9801 Acute bronchospasm: Secondary | ICD-10-CM | POA: Insufficient documentation

## 2016-10-10 DIAGNOSIS — N760 Acute vaginitis: Secondary | ICD-10-CM | POA: Insufficient documentation

## 2016-10-10 DIAGNOSIS — Z79899 Other long term (current) drug therapy: Secondary | ICD-10-CM | POA: Insufficient documentation

## 2016-10-10 LAB — POC URINE PREG, ED: PREG TEST UR: NEGATIVE

## 2016-10-10 NOTE — ED Triage Notes (Signed)
Short of breath for several days, feels congested, also has a foul smelling vaginal discharge

## 2016-10-10 NOTE — ED Provider Notes (Signed)
[facility] Provider Note   CSN: [id number] Arrival date & time: 10/10/16  2028 By signing my name below, I, Levon Hedger, attest that this documentation has been prepared under the direction and in the presence of Gilda Crease, MD . Electronically Signed: Levon Hedger, Scribe. 10/10/2016. 11:52 PM.   History   Chief Complaint Chief Complaint  Patient presents with  . Shortness of Breath  . Vaginal Discharge   HPI Wanda Montgomery is a 41 y.o. female who presents to the Emergency Department complaining of shortness of breath onset one week ago. Pt reports associated congestion, occasional intermit and intermittent forceful cough. No alleviating or modifying factors noted.  No OTC treatments tried for these symptoms PTA. Pt also complains of foul smelling vaginal discharge onset about one week ago. She has never experienced this before. She is a smoker. Pt has no other acute complaints or associated symptoms at this time.    The history is provided by the patient. No language interpreter was used.   Past Medical History:  Diagnosis Date  . Asthma   . Chronic back pain   . Migraine headache    There are no active problems to display for this patient.  Past Surgical History:  Procedure Laterality Date  . TUBAL LIGATION      OB History    No data available     Home Medications    Prior to Admission medications   Medication Sig Start Date End Date Taking? Authorizing Provider  albuterol (PROVENTIL HFA;VENTOLIN HFA) 108 (90 Base) MCG/ACT inhaler Inhale 2 puffs into the lungs every 6 (six) hours as needed for wheezing or shortness of breath. 05/14/16   Derwood Kaplan, MD  amoxicillin (AMOXIL) 500 MG capsule Take 500 mg by mouth 3 (three) times daily.    Historical Provider, MD  azithromycin (ZITHROMAX) 250 MG tablet Take 1 tablet (250 mg total) by mouth daily. Take first 2 tablets together, then 1 every day until finished. 05/17/16   Derwood Kaplan, MD    cephALEXin (KEFLEX) 500 MG capsule Take 1 capsule (500 mg total) by mouth 4 (four) times daily. Patient not taking: Reported on 05/14/2016 08/11/14   Janne Napoleon, NP  cyclobenzaprine (FLEXERIL) 5 MG tablet Take 1 tablet (5 mg total) by mouth 3 (three) times daily as needed for muscle spasms. Patient not taking: Reported on 05/14/2016 07/10/13   Hayden Rasmussen, NP  diclofenac (CATAFLAM) 50 MG tablet Take 1 tablet (50 mg total) by mouth 3 (three) times daily. Patient not taking: Reported on 05/14/2016 07/10/13   Hayden Rasmussen, NP  HYDROcodone-acetaminophen (NORCO/VICODIN) 5-325 MG per tablet Take 1 tablet by mouth every 4 (four) hours as needed. Patient not taking: Reported on 05/14/2016 08/11/14   Janne Napoleon, NP  metroNIDAZOLE (FLAGYL) 500 MG tablet Take 1 tablet (500 mg total) by mouth 2 (two) times daily. One po bid x 7 days 10/11/16   Gilda Crease, MD  phenazopyridine (PYRIDIUM) 200 MG tablet Take 1 tablet (200 mg total) by mouth 3 (three) times daily. Patient not taking: Reported on 05/14/2016 08/11/14   Janne Napoleon, NP  predniSONE (DELTASONE) 20 MG tablet Take 2 tablets (40 mg total) by mouth daily with breakfast. 10/11/16   Gilda Crease, MD  traMADol (ULTRAM) 50 MG tablet Take 1 tablet (50 mg total) by mouth every 6 (six) hours as needed. Patient not taking: Reported on 05/14/2016 07/10/13   Hayden Rasmussen, NP    Family History No family history on  file.  Social History Social History  Substance Use Topics  . Smoking status: Current Every Day Smoker    Packs/day: 0.50    Types: Cigarettes  . Smokeless tobacco: Never Used  . Alcohol use Yes    Allergies   Amoxicillin  Review of Systems Review of Systems All systems reviewed and are negative for acute change except as noted in the HPI.  Physical Exam Updated Vital Signs BP 117/78 (BP Location: Right Arm)   Pulse 82   Temp 99.3 F (37.4 C) (Oral)   Resp 19   Ht  (1.651 m)   Wt 235 lb (106.6 kg)   LMP  08/21/2016   SpO2 94%   BMI 39.11 kg/m   Physical Exam  Constitutional: She is oriented to person, place, and time. She appears well-developed and well-nourished. No distress.  HENT:  Head: Normocephalic and atraumatic.  Right Ear: Hearing normal.  Left Ear: Hearing normal.  Nose: Nose normal.  Mouth/Throat: Oropharynx is clear and moist and mucous membranes are normal.  Eyes: Conjunctivae and EOM are normal. Pupils are equal, round, and reactive to light.  Neck: Normal range of motion. Neck supple.  Cardiovascular: Regular rhythm, S1 normal and S2 normal.  Exam reveals no gallop and no friction rub.   No murmur heard. Pulmonary/Chest: Effort normal. No respiratory distress. She has wheezes. She exhibits no tenderness.  Scattered wheezes with good air movement  Abdominal: Soft. Normal appearance and bowel sounds are normal. There is no hepatosplenomegaly. There is no tenderness. There is no rebound, no guarding, no tenderness at McBurney's point and negative Murphy's sign. No hernia.  Genitourinary: Vaginal discharge (thick, yellow-green) found.  Musculoskeletal: Normal range of motion.  Neurological: She is alert and oriented to person, place, and time. She has normal strength. No cranial nerve deficit or sensory deficit. Coordination normal. GCS eye subscore is 4. GCS verbal subscore is 5. GCS motor subscore is 6.  Skin: Skin is warm, dry and intact. No rash noted. No cyanosis.  Psychiatric: She has a normal mood and affect. Her speech is normal and behavior is normal. Thought content normal.  Nursing note and vitals reviewed.  ED Treatments / Results  DIAGNOSTIC STUDIES:  Oxygen Saturation is 99% on RA, normal by my interpretation.    COORDINATION OF CARE:  11:51 PM Discussed treatment plan with pt at bedside and pt agreed to plan.   Labs (all labs ordered are listed, but only abnormal results are displayed) Labs Reviewed  WET PREP, GENITAL - Abnormal; Notable for the  following:       Result Value   WBC, Wet Prep HPF POC MANY (*)    All other components within normal limits  POC URINE PREG, ED  GC/CHLAMYDIA PROBE AMP ([place]) NOT AT [facility]    EKG  EKG Interpretation None       Radiology Dg Chest 2 View  Result Date: 10/10/2016 CLINICAL DATA:  Acute onset of mid chest pain, shortness breath and chest congestion. Chronic leg swelling. Initial encounter. EXAM: CHEST  2 VIEW COMPARISON:  Chest radiograph performed 05/14/2016 FINDINGS: The lungs are well-aerated. Mild peribronchial thickening is noted. Mild bilateral scarring is seen. There is no evidence of pleural effusion or pneumothorax. The heart is normal in size; the mediastinal contour is within normal limits. No acute osseous abnormalities are seen. IMPRESSION: Mild peribronchial thickening noted.  Mild bilateral scarring seen. Electronically Signed   By: Roanna Raider M.D.   On: 10/10/2016 22:49  Procedures Procedures (including critical care time)  Medications Ordered in ED Medications  albuterol (PROVENTIL HFA;VENTOLIN HFA) 108 (90 Base) MCG/ACT inhaler 2 puff (not administered)     Initial Impression / Assessment and Plan / ED Course  I have reviewed the triage vital signs and the nursing notes.  Pertinent labs & imaging results that were available during my care of the patient were reviewed by me and considered in my medical decision making (see chart for details).     Presents with cough, chest congestion and shortness of breath ongoing approximately 1 week. Examination reveals good air movement and oxygenation with scattered wheezing. Chest x-ray is clear, no evidence of pneumonia. Patient is a smoker. Will treat for bronchospasm, likely secondary to viral upper respiratory infection.  She also complaining of vaginal discharge. Examination reveals copious discharge without cervical motion tenderness. No bleeding. No pelvic tenderness, no adnexal masses or tenderness. Wet  prep reveals clue cells, will treat for BV.  Final Clinical Impressions(s) / ED Diagnoses   Final diagnoses:  URI, acute  Bronchospasm  BV (bacterial vaginosis)    New Prescriptions New Prescriptions   METRONIDAZOLE (FLAGYL) 500 MG TABLET    Take 1 tablet (500 mg total) by mouth 2 (two) times daily. One po bid x 7 days   PREDNISONE (DELTASONE) 20 MG TABLET    Take 2 tablets (40 mg total) by mouth daily with breakfast.   I personally performed the services described in this documentation, which was scribed in my presence. The recorded information has been reviewed and is accurate.     Gilda Crease, MD 10/11/16 [phone]

## 2016-10-11 LAB — WET PREP, GENITAL
Sperm: NONE SEEN
YEAST WET PREP: NONE SEEN

## 2016-10-11 MED ORDER — METRONIDAZOLE 500 MG PO TABS: 500.0000 mg | ORAL_TABLET | Freq: Two times a day (BID) | ORAL | 0 refills | Status: DC

## 2016-10-11 MED ORDER — PREDNISONE 20 MG PO TABS: 40.0000 mg | ORAL_TABLET | Freq: Every day | ORAL | 0 refills | Status: DC

## 2016-10-11 MED ORDER — ALBUTEROL SULFATE HFA 108 (90 BASE) MCG/ACT IN AERS
2.0000 | INHALATION_SPRAY | RESPIRATORY_TRACT | Status: DC | PRN
  Administered 2016-10-11: 2 via RESPIRATORY_TRACT
  Filled 2016-10-11 (×2): qty 6.7

## 2016-10-11 NOTE — ED Notes (Signed)
Pelvic cart to bedside 

## 2016-10-12 LAB — GC/CHLAMYDIA PROBE AMP (~~LOC~~) NOT AT ARMC
Chlamydia: NEGATIVE
Neisseria Gonorrhea: NEGATIVE

## 2017-04-23 ENCOUNTER — Encounter (HOSPITAL_COMMUNITY): Payer: Self-pay | Admitting: Emergency Medicine

## 2017-04-23 ENCOUNTER — Ambulatory Visit (HOSPITAL_COMMUNITY)
Admission: EM | Admit: 2017-04-23 | Discharge: 2017-04-23 | Disposition: A | Discharge status: Home / Self Care | Payer: Self-pay | Attending: Internal Medicine | Admitting: Internal Medicine

## 2017-04-23 DIAGNOSIS — J45901 Unspecified asthma with (acute) exacerbation: Secondary | ICD-10-CM

## 2017-04-23 MED ORDER — HYDROCOD POLST-CPM POLST ER 10-8 MG/5ML PO SUER: 5.0000 mL | Freq: Two times a day (BID) | ORAL | 0 refills | Status: AC | PRN

## 2017-04-23 MED ORDER — ALBUTEROL SULFATE HFA 108 (90 BASE) MCG/ACT IN AERS: 2.0000 | INHALATION_SPRAY | Freq: Four times a day (QID) | RESPIRATORY_TRACT | 2 refills | Status: DC | PRN

## 2017-04-23 MED ORDER — PREDNISONE 10 MG (21) PO TBPK: ORAL_TABLET | Freq: Every day | ORAL | 0 refills | Status: DC

## 2017-04-23 MED ORDER — ALBUTEROL SULFATE HFA 108 (90 BASE) MCG/ACT IN AERS
2.0000 | INHALATION_SPRAY | Freq: Once | RESPIRATORY_TRACT | Status: AC
  Administered 2017-04-23: 2 via RESPIRATORY_TRACT

## 2017-04-23 MED ORDER — ALBUTEROL SULFATE HFA 108 (90 BASE) MCG/ACT IN AERS
INHALATION_SPRAY | RESPIRATORY_TRACT | Status: AC
  Filled 2017-04-23: qty 6.7

## 2017-04-23 NOTE — ED Triage Notes (Signed)
Pt sts cough and congestion x 5 days

## 2017-04-23 NOTE — ED Provider Notes (Signed)
[facility]    CSN: [id number] Arrival date & time: 04/23/17  1316     History   Chief Complaint Chief Complaint  Patient presents with  . Cough    HPI Wanda Montgomery is a 41 y.o. female.   Has history of intermittent asthma fairly well controlled on albuterol as needed, presents today for coughing, wheezing, SOB and congestion x 5 days, not improving and not worsening. Cough is intermittent and productive. She ran out of her albuterol inhaler at home and is requesting a refill. Patient also reports chest tightness and pain and back pain during coughing spells.       Past Medical History:  Diagnosis Date  . Asthma   . Chronic back pain   . Migraine headache     There are no active problems to display for this patient.   Past Surgical History:  Procedure Laterality Date  . TUBAL LIGATION      OB History    No data available       Home Medications    Prior to Admission medications   Medication Sig Start Date End Date Taking? Authorizing Provider  albuterol (PROVENTIL HFA;VENTOLIN HFA) 108 (90 Base) MCG/ACT inhaler Inhale 2 puffs into the lungs every 6 (six) hours as needed for wheezing or shortness of breath. 05/14/16   Derwood Kaplan, MD  amoxicillin (AMOXIL) 500 MG capsule Take 500 mg by mouth 3 (three) times daily.    [provider]  azithromycin (ZITHROMAX) 250 MG tablet Take 1 tablet (250 mg total) by mouth daily. Take first 2 tablets together, then 1 every day until finished. 05/17/16   Derwood Kaplan, MD  cephALEXin (KEFLEX) 500 MG capsule Take 1 capsule (500 mg total) by mouth 4 (four) times daily. Patient not taking: Reported on 05/14/2016 08/11/14   Janne Napoleon, NP  cyclobenzaprine (FLEXERIL) 5 MG tablet Take 1 tablet (5 mg total) by mouth 3 (three) times daily as needed for muscle spasms. Patient not taking: Reported on 05/14/2016 07/10/13   Hayden Rasmussen, NP  diclofenac (CATAFLAM) 50 MG tablet Take 1 tablet (50 mg total) by  mouth 3 (three) times daily. Patient not taking: Reported on 05/14/2016 07/10/13   Hayden Rasmussen, NP  HYDROcodone-acetaminophen (NORCO/VICODIN) 5-325 MG per tablet Take 1 tablet by mouth every 4 (four) hours as needed. Patient not taking: Reported on 05/14/2016 08/11/14   Janne Napoleon, NP  metroNIDAZOLE (FLAGYL) 500 MG tablet Take 1 tablet (500 mg total) by mouth 2 (two) times daily. One po bid x 7 days 10/11/16   Gilda Crease, MD  phenazopyridine (PYRIDIUM) 200 MG tablet Take 1 tablet (200 mg total) by mouth 3 (three) times daily. Patient not taking: Reported on 05/14/2016 08/11/14   Janne Napoleon, NP  predniSONE (DELTASONE) 20 MG tablet Take 2 tablets (40 mg total) by mouth daily with breakfast. 10/11/16   Gilda Crease, MD  traMADol (ULTRAM) 50 MG tablet Take 1 tablet (50 mg total) by mouth every 6 (six) hours as needed. Patient not taking: Reported on 05/14/2016 07/10/13   Hayden Rasmussen, NP    Family History History reviewed. No pertinent family history.  Social History Social History   Tobacco Use  . Smoking status: Current Every Day Smoker    Packs/day: 0.50    Types: Cigarettes  . Smokeless tobacco: Never Used  Substance Use Topics  . Alcohol use: Yes  . Drug use: No     Allergies   Amoxicillin   Review  of Systems Review of Systems  Constitutional: Negative for chills and fever.  HENT: Positive for congestion, rhinorrhea, sinus pressure, sinus pain and sneezing. Negative for sore throat.   Respiratory: Positive for cough, chest tightness, shortness of breath and wheezing.   Cardiovascular: Positive for chest pain (intermittent associated with coughing). Negative for palpitations.  Gastrointestinal: Positive for abdominal pain. Negative for nausea and vomiting.  Skin: Negative for rash.  Neurological: Positive for headaches. Negative for dizziness.     Physical Exam Triage Vital Signs ED Triage Vitals  Enc Vitals Group     BP 04/23/17 1347 116/68      Pulse Rate 04/23/17 1347 78     Resp 04/23/17 1347 18     Temp 04/23/17 1347 98.2 F (36.8 C)     Temp Source 04/23/17 1347 Oral     SpO2 04/23/17 1347 99 %     Weight --      Height --      Head Circumference --      Peak Flow --      Pain Score 04/23/17 1348 7     Pain Loc --      Pain Edu? --      Excl. in GC? --    No data found.  Updated Vital Signs BP 116/68 (BP Location: Right Arm)   Pulse 78   Temp 98.2 F (36.8 C) (Oral)   Resp 18   SpO2 99%   Visual Acuity Right Eye Distance:   Left Eye Distance:   Bilateral Distance:    Right Eye Near:   Left Eye Near:    Bilateral Near:     Physical Exam  Constitutional: She is oriented to person, place, and time. She appears well-developed and well-nourished.  HENT:  Head: Normocephalic and atraumatic.  Right Ear: External ear normal.  Left Ear: External ear normal.  Nose: Nose normal.  Mouth/Throat: Oropharynx is clear and moist. No oropharyngeal exudate.  Eyes: Conjunctivae are normal. Pupils are equal, round, and reactive to light.  Neck: Normal range of motion. Neck supple.  Cardiovascular: Normal rate, regular rhythm and normal heart sounds.  Pulmonary/Chest: Effort normal. She has wheezes.  Abdominal: Soft. Bowel sounds are normal. There is no tenderness.  Lymphadenopathy:    She has no cervical adenopathy.  Neurological: She is alert and oriented to person, place, and time.  Skin: Skin is warm and dry. She is not diaphoretic.  Nursing note and vitals reviewed.    UC Treatments / Results  Labs (all labs ordered are listed, but only abnormal results are displayed) Labs Reviewed - No data to display  EKG  EKG Interpretation None       Radiology No results found.  Procedures Procedures (including critical care time)  Medications Ordered in UC Medications  albuterol (PROVENTIL HFA;VENTOLIN HFA) 108 (90 Base) MCG/ACT inhaler 2 puff (not administered)     Initial Impression / Assessment and  Plan / UC Course  I have reviewed the triage vital signs and the nursing notes.  Pertinent labs & imaging results that were available during my care of the patient were reviewed by me and considered in my medical decision making (see chart for details).   Final Clinical Impressions(s) / UC Diagnoses   Final diagnoses:  None   DuoNeb Offered by patient declined; she requested for albuterol inhaler instead, albuterol inhaler 2 puffs administered per request.   Patient is clinically well to be discharged home. RX for albuterol inhaler given to use  PRN. 6-day prednisone given.   ER precaution discussed.   OTC supportive treatment discussed as well.   ED Discharge Orders    None     Controlled Substance Prescriptions [place] Controlled Substance Registry consulted? Not Applicable   Lucia EstelleZheng, Donnisha Besecker, NP 04/23/17 [phone]

## 2017-09-22 NOTE — Progress Notes (Signed)
Cardiology Office Note   Date:  09/24/2017   ID:  Wanda Montgomery, DOB 06-17-1976, MRN [id number]  PCP:  Patient, No Pcp Per  Cardiologist:   No primary care provider on file. Referring:  Riley Churches, FNP  Chief Complaint  Patient presents with  . Chest Pain      History of Present Illness: Wanda Montgomery is a 42 y.o. female who is referred by Riley Churches for evaluation of chest pain.  The patient has no past cardiac history.  Is been having chest discomfort for about 6 months.  Is been getting progressively worse.  It happens a couple of times per week.  3 weeks ago it was very severe.  He was seated doing nothing.  She developed a sharp constant discomfort to her lower sternum.  She had radiation into her arms.  The chest discomfort waxed and waned with the left arm discomfort persisted down into her hand for over 2 hours.  It was 9 out of 10 in intensity.  Was similar to the discomfort she been having previously but more severe.  She felt some slight nausea.  Is not describing acute shortness of breath.  It did not seem to be positional try to find a way to relax.  She says when she gets this discomfort it can happen with emotional stress or sporadically.  It does not seem to happen with physical activity although she is not particularly active will do things like carrying groceries.  He did not see a medical doctor at the time of the discomfort.  Numbness and discomfort in her legs and has labs pending to include thyroid studies.  No history of diabetes dyslipidemia or hypertension although she is having again labs to evaluate.   Past Medical History:  Diagnosis Date  . Asthma   . Chronic back pain   . Migraine headache     Past Surgical History:  Procedure Laterality Date  . TUBAL LIGATION       No current outpatient medications on file.   No current facility-administered medications for this visit.     Allergies:   Amoxicillin    Social History:  The  patient  reports that she has been smoking cigarettes.  She has been smoking about 0.50 packs per day. She has never used smokeless tobacco. She reports that she drinks alcohol. She reports that she does not use drugs.   Family History:  The patient's family history includes Pneumonia in her mother.  She has a normal father's family history very much.  She reports heart disease in both sets of grandparents.   ROS:  Please see the history of present illness.   Otherwise, review of systems are positive for hot flashes.   All other systems are reviewed and negative.    PHYSICAL EXAM: VS:  BP (!) 130/96 (BP Location: Right Arm)   Pulse 78   Ht 5\' 6"  (1.676 m)   Wt 241 lb 3.2 oz (109.4 kg)   BMI 38.93 kg/m  , BMI Body mass index is 38.93 kg/m. GENERAL:  Well appearing HEENT:  Pupils equal round and reactive, fundi not visualized, oral mucosa unremarkable NECK:  No jugular venous distention, waveform within normal limits, carotid upstroke brisk and symmetric, no bruits, no thyromegaly LYMPHATICS:  No cervical, inguinal adenopathy LUNGS:  Clear to auscultation bilaterally BACK:  No CVA tenderness CHEST:  Unremarkable HEART:  PMI not displaced or sustained,S1 and S2 within normal limits, no S3, no S4,  no clicks, no rubs, no murmurs ABD:  Flat, positive bowel sounds normal in frequency in pitch, no bruits, no rebound, no guarding, no midline pulsatile mass, no hepatomegaly, no splenomegaly EXT:  2 plus pulses throughout, no edema, no cyanosis no clubbing SKIN:  No rashes no nodules NEURO:  Cranial nerves II through XII grossly intact, motor grossly intact throughout PSYCH:  Cognitively intact, oriented to person place and time    EKG:  EKG is ordered today. The ekg ordered today demonstrates sinus rhythm, rate 78, incomplete right bundle branch block, no acute ST-T wave changes.   Recent Labs: No results found for requested labs within last 8760 hours.    Lipid Panel No results found  for: CHOL, TRIG, HDL, CHOLHDL, VLDL, LDLCALC, LDLDIRECT    Wt Readings from Last 3 Encounters:  09/24/17 241 lb 3.2 oz (109.4 kg)  10/10/16 235 lb (106.6 kg)  05/14/16 223 lb (101.2 kg)      Other studies Reviewed: Additional studies/ records that were reviewed today include: Office records. Review of the above records demonstrates:  Please see elsewhere in the note.     ASSESSMENT AND PLAN:  CHEST PAIN:   Chest pain has atypical more than typical components she does not have severe risk factors smoking history.  Family history is incomplete.  We are waiting for lipids.  I will bring the patient back for a POET (Plain Old Exercise Test). This will allow me to screen for obstructive coronary disease, risk stratify and very importantly provide a prescription for exercise.  TOBACCO:    She understands the importance of stopping smoking.  ABNORMAL EKG:  I will check an echo.     Current medicines are reviewed at length with the patient today.  The patient does not have concerns regarding medicines.  The following changes have been made:  no change  Labs/ tests ordered today include:   Orders Placed This Encounter  Procedures  . EXERCISE TOLERANCE TEST (ETT)  . EKG 12-Lead  . ECHOCARDIOGRAM COMPLETE     Disposition:   FU with me as needed.      Signed, Rollene RotundaJames Raven Harmes, MD  09/24/2017 5:41 PM    [facility]

## 2017-09-23 DIAGNOSIS — R079 Chest pain, unspecified: Secondary | ICD-10-CM | POA: Insufficient documentation

## 2017-09-24 ENCOUNTER — Ambulatory Visit (INDEPENDENT_AMBULATORY_CARE_PROVIDER_SITE_OTHER): Payer: Self-pay | Admitting: Cardiology

## 2017-09-24 ENCOUNTER — Encounter: Payer: Self-pay | Admitting: Cardiology

## 2017-09-24 VITALS — BP 130/96 | HR 78 | Ht 66.0 in | Wt 241.2 lb

## 2017-09-24 DIAGNOSIS — R0789 Other chest pain: Secondary | ICD-10-CM

## 2017-09-24 DIAGNOSIS — R0602 Shortness of breath: Secondary | ICD-10-CM

## 2017-09-24 DIAGNOSIS — R9431 Abnormal electrocardiogram [ECG] [EKG]: Secondary | ICD-10-CM

## 2017-09-24 NOTE — Patient Instructions (Addendum)
Medication Instructions:  Continue current medications  If you need a refill on your cardiac medications before your next appointment, please call your pharmacy.  Labwork: None Ordered   Testing/Procedures: Your physician has requested that you have an exercise tolerance test. For further information please visit [email or web address]. Please also follow instruction sheet, as given.  Your physician has requested that you have an echocardiogram. Echocardiography is a painless test that uses sound waves to create images of your heart. It provides your doctor with information about the size and shape of your heart and how well your heart's chambers and valves are working. This procedure takes approximately one hour. There are no restrictions for this procedure.   Follow-Up: Your physician wants you to follow-up in: As Needed.     Thank you for choosing [facility]!!

## 2017-10-03 ENCOUNTER — Ambulatory Visit (HOSPITAL_COMMUNITY)
Admission: RE | Admit: 2017-10-03 | Discharge: 2017-10-03 | Disposition: A | Discharge status: Home / Self Care | Payer: Self-pay | Source: Ambulatory Visit | Attending: Cardiology | Admitting: Cardiology

## 2017-10-03 DIAGNOSIS — R0602 Shortness of breath: Secondary | ICD-10-CM | POA: Insufficient documentation

## 2017-10-03 DIAGNOSIS — R0789 Other chest pain: Secondary | ICD-10-CM | POA: Insufficient documentation

## 2017-10-03 DIAGNOSIS — F17210 Nicotine dependence, cigarettes, uncomplicated: Secondary | ICD-10-CM | POA: Insufficient documentation

## 2017-10-03 DIAGNOSIS — R9431 Abnormal electrocardiogram [ECG] [EKG]: Secondary | ICD-10-CM | POA: Insufficient documentation

## 2017-10-03 LAB — EXERCISE TOLERANCE TEST
CSEPED: 6 min
CSEPEW: 7 METS
CSEPHR: 89 %
CSEPPHR: 160 {beats}/min
Exercise duration (sec): 4 s
MPHR: 179 {beats}/min
RPE: 14
Rest HR: 72 {beats}/min

## 2017-10-03 NOTE — Progress Notes (Signed)
*  PRELIMINARY RESULTS* Echocardiogram 2D Echocardiogram has been performed.  Jeryl Columbialliott, Mari Battaglia 10/03/2017, 10:31 AM

## 2017-10-17 ENCOUNTER — Other Ambulatory Visit (HOSPITAL_COMMUNITY): Payer: Self-pay | Admitting: Family

## 2017-10-17 DIAGNOSIS — Z1231 Encounter for screening mammogram for malignant neoplasm of breast: Secondary | ICD-10-CM

## 2017-10-21 ENCOUNTER — Ambulatory Visit (HOSPITAL_COMMUNITY)
Admission: RE | Admit: 2017-10-21 | Discharge: 2017-10-21 | Disposition: A | Discharge status: Home / Self Care | Payer: Self-pay | Source: Ambulatory Visit | Attending: Family | Admitting: Family

## 2017-10-21 ENCOUNTER — Encounter (HOSPITAL_COMMUNITY): Payer: Self-pay | Admitting: Radiology

## 2017-10-21 DIAGNOSIS — Z1231 Encounter for screening mammogram for malignant neoplasm of breast: Secondary | ICD-10-CM | POA: Insufficient documentation

## 2017-10-24 NOTE — Progress Notes (Signed)
Cardiology Office Note   Date:  10/25/2017   ID:  Wanda Montgomery, DOB 12/08/1975, MRN [id number]  PCP:  Patient, No Pcp Per  Cardiologist:   No primary care provider on file. Referring:  Riley Churches, FNP  Chief Complaint  Patient presents with  . Abnormal Echo      History of Present Illness: Wanda Montgomery is a 42 y.o. female who was referred by Riley Churches for evaluation of chest pain.    After the first visit I sent her for an echo which showed moderate LVH and a low normal EF.  POET (Plain Old Exercise Treadmill) demonstrated no ischemia or infarct.   She had a moderate exercise tolerance.  Since I last saw her she has not been having any further chest pain .  The patient denies any new symptoms such as chest discomfort, neck or arm discomfort. There has been no new shortness of breath, PND or orthopnea. There have been no reported palpitations, presyncope or syncope.  She has had no new palpitations, presyncope or syncope.    Past Medical History:  Diagnosis Date  . Asthma   . Chronic back pain   . Hypothyroid   . Migraine headache     Past Surgical History:  Procedure Laterality Date  . TUBAL LIGATION       No current outpatient medications on file.   No current facility-administered medications for this visit.     Allergies:   Amoxicillin    ROS:  Please see the history of present illness.   Otherwise, review of systems are positive for none.   All other systems are reviewed and negative.    PHYSICAL EXAM: VS:  BP 119/84   Pulse 75   Ht  (1.651 m)   Wt 246 lb 12.8 oz (111.9 kg)   SpO2 96%   BMI 41.07 kg/m  , BMI Body mass index is 41.07 kg/m.  GENERAL:  Well appearing NECK:  No jugular venous distention, waveform within normal limits, carotid upstroke brisk and symmetric, no bruits, no thyromegaly LUNGS:  Clear to auscultation bilaterally CHEST:  Unremarkable HEART:  PMI not displaced or sustained,S1 and S2 within normal limits,  no S3, no S4, no clicks, no rubs, no murmurs ABD:  Flat, positive bowel sounds normal in frequency in pitch, no bruits, no rebound, no guarding, no midline pulsatile mass, no hepatomegaly, no splenomegaly EXT:  2 plus pulses throughout, no edema, no cyanosis no clubbing    EKG:  EKG is  not ordered today.    Recent Labs: No results found for requested labs within last 8760 hours.    Lipid Panel No results found for: CHOL, TRIG, HDL, CHOLHDL, VLDL, LDLCALC, LDLDIRECT    Wt Readings from Last 3 Encounters:  10/25/17 246 lb 12.8 oz (111.9 kg)  09/24/17 241 lb 3.2 oz (109.4 kg)  10/10/16 235 lb (106.6 kg)      Other studies Reviewed: Additional studies/ records that were reviewed today include: echo, POET Review of the above records demonstrates:    See elsewhere   ASSESSMENT AND PLAN:  CHEST PAIN:   POET (Plain Old Exercise Treadmill) was negative.  She is not having further chest pain.  We talked at great length about diet and exercise and I gave her specific instructions.    TOBACCO:    She has cut back.  She knows she needs to completely abstain.  She understands the importance of stopping smoking.  ABNORMAL ECHO:  We are going to work on lifestyle first with diet, exercise, weight loss.  I would like to repeat an echo in six months and see if there is any change.  I might consider further testing with MRI to evaluate the LVH.    HYPOTHYROID:  She was off meds for quite awhile and this is now being treated.   Current medicines are reviewed at length with the patient today.  The patient does not have concerns regarding medicines.  The following changes have been made:  None  Labs/ tests ordered today include:  None  Orders Placed This Encounter  Procedures  . ECHOCARDIOGRAM COMPLETE     Disposition:   FU with me in six months after repeat echo.      Signed, Rollene Rotunda, MD  10/25/2017 12:58 PM    [facility]

## 2017-10-25 ENCOUNTER — Encounter: Payer: Self-pay | Admitting: Cardiology

## 2017-10-25 ENCOUNTER — Ambulatory Visit (INDEPENDENT_AMBULATORY_CARE_PROVIDER_SITE_OTHER): Payer: Self-pay | Admitting: Cardiology

## 2017-10-25 VITALS — BP 119/84 | HR 75 | Ht 65.0 in | Wt 246.8 lb

## 2017-10-25 DIAGNOSIS — I517 Cardiomegaly: Secondary | ICD-10-CM

## 2017-10-25 NOTE — Patient Instructions (Addendum)
Medication Instructions:  Continue current medications  If you need a refill on your cardiac medications before your next appointment, please call your pharmacy.  Labwork: None Ordered    Testing/Procedures: Your physician has requested that you have an echocardiogram in 6 Months. Echocardiography is a painless test that uses sound waves to create images of your heart. It provides your doctor with information about the size and shape of your heart and how well your heart's chambers and valves are working. This procedure takes approximately one hour. There are no restrictions for this procedure.  Follow-Up: Your physician wants you to follow-up in: 6 Months. You should receive a reminder letter in the mail two months in advance. If you do not receive a letter, please call our office [phone].    Thank you for choosing [facility]!!       

## 2018-03-31 ENCOUNTER — Other Ambulatory Visit: Payer: Self-pay

## 2018-03-31 ENCOUNTER — Emergency Department (HOSPITAL_COMMUNITY): Payer: No Typology Code available for payment source

## 2018-03-31 ENCOUNTER — Emergency Department (HOSPITAL_COMMUNITY)
Admission: EM | Admit: 2018-03-31 | Discharge: 2018-03-31 | Disposition: A | Discharge status: Home / Self Care | Payer: No Typology Code available for payment source | Attending: Emergency Medicine | Admitting: Emergency Medicine

## 2018-03-31 ENCOUNTER — Encounter (HOSPITAL_COMMUNITY): Payer: Self-pay

## 2018-03-31 DIAGNOSIS — S39012A Strain of muscle, fascia and tendon of lower back, initial encounter: Secondary | ICD-10-CM | POA: Diagnosis not present

## 2018-03-31 DIAGNOSIS — F17210 Nicotine dependence, cigarettes, uncomplicated: Secondary | ICD-10-CM | POA: Diagnosis not present

## 2018-03-31 DIAGNOSIS — E039 Hypothyroidism, unspecified: Secondary | ICD-10-CM | POA: Insufficient documentation

## 2018-03-31 DIAGNOSIS — J45909 Unspecified asthma, uncomplicated: Secondary | ICD-10-CM | POA: Insufficient documentation

## 2018-03-31 DIAGNOSIS — Y92410 Unspecified street and highway as the place of occurrence of the external cause: Secondary | ICD-10-CM | POA: Diagnosis not present

## 2018-03-31 DIAGNOSIS — S199XXA Unspecified injury of neck, initial encounter: Secondary | ICD-10-CM | POA: Diagnosis present

## 2018-03-31 DIAGNOSIS — S161XXA Strain of muscle, fascia and tendon at neck level, initial encounter: Secondary | ICD-10-CM | POA: Insufficient documentation

## 2018-03-31 DIAGNOSIS — T148XXA Other injury of unspecified body region, initial encounter: Secondary | ICD-10-CM

## 2018-03-31 DIAGNOSIS — Y998 Other external cause status: Secondary | ICD-10-CM | POA: Insufficient documentation

## 2018-03-31 DIAGNOSIS — Y9389 Activity, other specified: Secondary | ICD-10-CM | POA: Insufficient documentation

## 2018-03-31 DIAGNOSIS — M5442 Lumbago with sciatica, left side: Secondary | ICD-10-CM | POA: Insufficient documentation

## 2018-03-31 DIAGNOSIS — M5441 Lumbago with sciatica, right side: Secondary | ICD-10-CM | POA: Diagnosis not present

## 2018-03-31 MED ORDER — PREDNISONE 50 MG PO TABS
60.0000 mg | ORAL_TABLET | Freq: Once | ORAL | Status: AC
  Administered 2018-03-31: 15:00:00 60 mg via ORAL
  Filled 2018-03-31: qty 1

## 2018-03-31 MED ORDER — KETOROLAC TROMETHAMINE 60 MG/2ML IM SOLN
15.0000 mg | Freq: Once | INTRAMUSCULAR | Status: AC
  Administered 2018-03-31: 15 mg via INTRAMUSCULAR
  Filled 2018-03-31: qty 2

## 2018-03-31 MED ORDER — PREDNISONE 10 MG (21) PO TBPK: ORAL_TABLET | Freq: Every day | ORAL | 0 refills | Status: DC
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
## 2018-03-31 NOTE — Discharge Instructions (Signed)
Take prednisone as prescribed.  Continue taking home medications including your muscle relaxer. Use ice packs or heating pads if this helps control your pain. Use heat/ice to help with your pain.  You will likely have continued muscle stiffness and soreness over the next couple days.  Follow-up with primary care in 1 week if your symptoms are not improving. Return to the emergency room if you develop vision changes, vomiting, slurred speech, numbness, loss of bowel or bladder control, or any new or worsening symptoms.
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
## 2018-04-02 ENCOUNTER — Emergency Department (HOSPITAL_COMMUNITY)
Admission: EM | Admit: 2018-04-02 | Discharge: 2018-04-02 | Disposition: A | Discharge status: Home / Self Care | Payer: No Typology Code available for payment source | Attending: Emergency Medicine | Admitting: Emergency Medicine

## 2018-04-02 ENCOUNTER — Other Ambulatory Visit: Payer: Self-pay

## 2018-04-02 ENCOUNTER — Encounter (HOSPITAL_COMMUNITY): Payer: Self-pay | Admitting: *Deleted

## 2018-04-02 DIAGNOSIS — Z79899 Other long term (current) drug therapy: Secondary | ICD-10-CM | POA: Insufficient documentation

## 2018-04-02 DIAGNOSIS — J45909 Unspecified asthma, uncomplicated: Secondary | ICD-10-CM | POA: Insufficient documentation

## 2018-04-02 DIAGNOSIS — M546 Pain in thoracic spine: Secondary | ICD-10-CM | POA: Insufficient documentation

## 2018-04-02 DIAGNOSIS — E039 Hypothyroidism, unspecified: Secondary | ICD-10-CM | POA: Diagnosis not present

## 2018-04-02 DIAGNOSIS — F17210 Nicotine dependence, cigarettes, uncomplicated: Secondary | ICD-10-CM | POA: Insufficient documentation

## 2018-04-02 DIAGNOSIS — M549 Dorsalgia, unspecified: Secondary | ICD-10-CM | POA: Diagnosis present

## 2018-04-02 MED ORDER — METHOCARBAMOL 500 MG PO TABS: 500.0000 mg | ORAL_TABLET | Freq: Three times a day (TID) | ORAL | 0 refills | Status: AC

## 2018-04-02 MED ORDER — MELOXICAM 7.5 MG PO TABS: 7.5000 mg | ORAL_TABLET | Freq: Every day | ORAL | 0 refills | Status: AC

## 2018-04-02 MED ORDER — LIDOCAINE 5 % EX PTCH: 1.0000 | MEDICATED_PATCH | CUTANEOUS | 0 refills | Status: DC

## 2018-04-02 MED ORDER — HYDROCODONE-ACETAMINOPHEN 5-325 MG PO TABS: 1.0000 | ORAL_TABLET | Freq: Four times a day (QID) | ORAL | 0 refills | Status: DC | PRN

## 2018-04-02 MED ORDER — DICLOFENAC SODIUM 1 % TD GEL: 4.0000 g | Freq: Four times a day (QID) | TRANSDERMAL | 0 refills | Status: DC
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
## 2018-04-02 NOTE — ED Notes (Signed)
Pt verbalized understanding discharge instructions and denies any further needs or questions at this time. VS stable, ambulatory and steady gait.   
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
## 2018-04-02 NOTE — Discharge Instructions (Addendum)
Take it easy, but do not lay around too much as this may make any stiffness worse.  Antiinflammatory medications: Take 600 mg of ibuprofen every 6 hours or 440 mg (over the counter dose) to 500 mg (prescription dose) of naproxen every 12 hours for the next 3 days. After this time, these medications may be used as needed for pain. Take these medications with food to avoid upset stomach. Choose only one of these medications, do not take them together. Acetaminophen (generic for Tylenol): Should you continue to have additional pain while taking the ibuprofen or naproxen, you may add in acetaminophen as needed. Your daily total maximum amount of acetaminophen from all sources should be limited to 4000mg /day for persons without liver problems, or 2000mg /day for those with liver problems. Vicodin: May take Vicodin (hydrocodone-acetaminophen) as needed for severe pain.  Do not drive or perform other dangerous activities while taking the Vicodin.  Please note that each pill of Vicodin contains 325 mg of acetaminophen (Tylenol) and the above dosage limits apply. Meloxicam: As an alternative to using ibuprofen or naproxen, may use the meloxicam. Diclofenac gel: This medication is a topical anti-inflammatory that may be used as an alternative to oral anti-inflammatories. Muscle relaxer: Robaxin is a muscle relaxer and may help loosen stiff muscles. Do not take the Robaxin while driving or performing other dangerous activities.  Lidocaine patches: These are available via either prescription or over-the-counter. The over-the-counter option may be more economical one and are likely just as effective. There are multiple over-the-counter brands, such as Salonpas. Exercises: Be sure to perform the attached exercises starting with three times a week and working up to performing them daily. This is an essential part of preventing long term problems.  Follow up: Follow up with a primary care provider or orthopedist for any  future management of these complaints. Be sure to follow up within 7-10 days. Return: Return to the ED should symptoms worsen.  For prescription assistance, may try using prescription discount sites or apps, such as goodrx.com

## 2018-04-28 ENCOUNTER — Ambulatory Visit (HOSPITAL_COMMUNITY)
Admission: RE | Admit: 2018-04-28 | Discharge: 2018-04-28 | Disposition: A | Discharge status: Home / Self Care | Payer: Self-pay | Source: Ambulatory Visit | Attending: Cardiology | Admitting: Cardiology

## 2018-04-28 DIAGNOSIS — I517 Cardiomegaly: Secondary | ICD-10-CM | POA: Insufficient documentation

## 2018-04-28 NOTE — Progress Notes (Signed)
*  PRELIMINARY RESULTS* Echocardiogram 2D Echocardiogram has been performed.  Wanda Montgomery 04/28/2018, 11:45 AM

## 2018-05-08 NOTE — Progress Notes (Signed)
Cardiology Office Note   Date:  05/09/2018   ID:  Wanda Montgomery, DOB 02-10-1976, MRN [id number]  PCP:  System, Provider Not In  Cardiologist:   No primary care provider on file. Referring:  Wanda Churches, FNP   Chief Complaint  Patient presents with  . Chest Pain    History of Present Illness: Wanda Montgomery is a 42 y.o. female who was referred by Wanda Montgomery for evaluation of chest pain.    After the first visit I sent her for an echo which showed moderate LVH and a low normal EF.  POET (Plain Old Exercise Treadmill) demonstrated no ischemia or infarct.   She had a moderate exercise tolerance.  Since I last saw her she had a follow up echo with NL EF now and only mild LVH.     Since I last saw her she has done better.  She has been working 2 jobs.  She is changed her diet.  She is lost about 15 pounds.  She feels like she is not having the chest discomfort she was having previously.  She is not having any new shortness of breath, PND or orthopnea.  She is had no edema.  Past Medical History:  Diagnosis Date  . Asthma   . Chronic back pain   . Hypothyroid   . Migraine headache     Past Surgical History:  Procedure Laterality Date  . TUBAL LIGATION       Current Outpatient Medications  Medication Sig Dispense Refill  . albuterol (PROVENTIL HFA;VENTOLIN HFA) 108 (90 Base) MCG/ACT inhaler INHALE 2 PUFFS PO Q 6 H PRN  1  . cyclobenzaprine (FLEXERIL) 5 MG tablet Take by mouth.    . gabapentin (NEURONTIN) 300 MG capsule Take 300 mg by mouth 3 (three) times daily.    Marland Kitchen levothyroxine (SYNTHROID, LEVOTHROID) 50 MCG tablet Take 50 mcg by mouth daily before breakfast.    . predniSONE (STERAPRED UNI-PAK 21 TAB) 10 MG (21) TBPK tablet Take by mouth daily. Take 6 tabs by mouth daily  for 2 days, then 5 tabs for 2 days, then 4 tabs for 2 days, then 3 tabs for 2 days, 2 tabs for 2 days, then 1 tab by mouth daily for 2 days 42 tablet 0   No current facility-administered  medications for this visit.     Allergies:   Amoxicillin    ROS:  Please see the history of present illness.   Otherwise, review of systems are positive for none.   All other systems are reviewed and negative.    PHYSICAL EXAM: VS:  BP 126/74   Pulse 72   Ht 5\' 5"  (1.651 m)   Wt 233 lb 12.8 oz (106.1 kg)   BMI 38.91 kg/m  , BMI Body mass index is 38.91 kg/m.  GENERAL:  Well appearing NECK:  No jugular venous distention, waveform within normal limits, carotid upstroke brisk and symmetric, no bruits, no thyromegaly LUNGS:  Clear to auscultation bilaterally CHEST:  Unremarkable HEART:  PMI not displaced or sustained,S1 and S2 within normal limits, no S3, no S4, no clicks, no rubs, no murmurs ABD:  Flat, positive bowel sounds normal in frequency in pitch, no bruits, no rebound, no guarding, no midline pulsatile mass, no hepatomegaly, no splenomegaly EXT:  2 plus pulses throughout, no edema, no cyanosis no clubbing   EKG:  EKG is ordered today. Sinus rhythm, rate 72, RSR prime V1 and V2, nonspecific ST-T wave changes.  Recent Labs: No results found for requested labs within last 8760 hours.    Lipid Panel No results found for: CHOL, TRIG, HDL, CHOLHDL, VLDL, LDLCALC, LDLDIRECT    Wt Readings from Last 3 Encounters:  05/09/18 233 lb 12.8 oz (106.1 kg)  03/31/18 233 lb (105.7 kg)  10/25/17 246 lb 12.8 oz (111.9 kg)      Other studies Reviewed: Additional studies/ records that were reviewed today include: Echo Review of the above records demonstrates:       ASSESSMENT AND PLAN:  CHEST PAIN:   POET (Plain Old Exercise Treadmill) was negative.  Her chest pain is atypical and now seems to be much less bothersome.  No further work-up is suggested.   TOBACCO: She is down to half pack a day and we discussed continuing to reduce this.   ABNORMAL ECHO:   EF is now improved.  LVH is mild.  Given the improvement hold off on further testing but will repeat an echocardiogram in 1  year.  She is going to concentrate on weight loss diet and exercise.  If she has any increasing symptoms she will let me know.  HYPOTHYROID:  This has been titrated recently.    Current medicines are reviewed at length with the patient today.  The patient does not have concerns regarding medicines.  The following changes have been made:  None  Labs/ tests ordered today include:    Orders Placed This Encounter  Procedures  . ECHOCARDIOGRAM COMPLETE     Disposition:   FU with me in one year.    Signed, Rollene RotundaJames Hadden Steig, MD  05/09/2018 2:14 PM    [facility]

## 2018-05-09 ENCOUNTER — Ambulatory Visit: Payer: Self-pay | Admitting: Cardiology

## 2018-05-09 ENCOUNTER — Ambulatory Visit (INDEPENDENT_AMBULATORY_CARE_PROVIDER_SITE_OTHER): Payer: Self-pay | Admitting: Cardiology

## 2018-05-09 ENCOUNTER — Encounter: Payer: Self-pay | Admitting: Cardiology

## 2018-05-09 VITALS — BP 126/74 | HR 72 | Ht 65.0 in | Wt 233.8 lb

## 2018-05-09 DIAGNOSIS — I517 Cardiomegaly: Secondary | ICD-10-CM

## 2018-05-09 DIAGNOSIS — R9431 Abnormal electrocardiogram [ECG] [EKG]: Secondary | ICD-10-CM

## 2018-05-09 DIAGNOSIS — R0602 Shortness of breath: Secondary | ICD-10-CM

## 2018-05-09 NOTE — Patient Instructions (Addendum)
Medication Instructions:  NOT NEEDED  If you need a refill on your cardiac medications before your next appointment, please call your pharmacy.   Lab work: NOT NEEDED If you have labs (blood work) drawn today and your tests are completely normal, you will receive your results only by: Marland Kitchen. MyChart Message (if you have MyChart) OR . A paper copy in the mail If you have any lab test that is abnormal or we need to change your treatment, we will call you to review the results.  Testing/Procedures: SCHEDULE AT [address] IN NOV 2020 Your physician has requested that you have an echocardiogram. Echocardiography is a painless test that uses sound waves to create images of your heart. It provides your doctor with information about the size and shape of your heart and how well your heart's chambers and valves are working. This procedure takes approximately one hour. There are no restrictions for this procedure.    Follow-Up: At [facility], you and your health needs are our priority.  As part of our continuing mission to provide you with exceptional heart care, we have created designated Provider Care Teams.  These Care Teams include your primary Cardiologist (physician) and Advanced Practice Providers (APPs -  Physician Assistants and Nurse Practitioners) who all work together to provide you with the care you need, when you need it. You will need a follow up appointment in 12 months  NOV 2019.  Please call our office 2 months in advance to schedule this appointment.  You may see DR [facility] or one of the following Advanced Practice Providers on your designated Care Team:   Theodore DemarkRhonda Barrett, PA-C . Joni ReiningKathryn Lawrence, DNP, ANP  Any Other Special Instructions Will Be Listed Below (If Applicable). NOT NEEDED

## 2018-05-21 NOTE — Addendum Note (Signed)
Addended by: Dorris FetchANDERSON, Ruvi Fullenwider on: 05/21/2018 08:35 AM   Modules accepted: Orders

## 2019-09-25 ENCOUNTER — Telehealth: Payer: Self-pay | Admitting: Cardiology

## 2019-09-25 DIAGNOSIS — R072 Precordial pain: Secondary | ICD-10-CM

## 2019-09-25 DIAGNOSIS — I517 Cardiomegaly: Secondary | ICD-10-CM

## 2019-09-25 NOTE — Telephone Encounter (Signed)
Patient calling stating she was supposed to have tests done last year and would like to know what those were. She would like to have them done and then schedule a follow up appointment after. She also states she has been having a numbness and tingling on her left side and believes it may be a blood flow problem. She states she is not having any chest pain or SOB. Please advise.

## 2019-09-25 NOTE — Telephone Encounter (Signed)
Called and spoke with pt she states that she was supposed to have a 12 month follow up with Dr.Hochrein and some "tests" done prior to this visit as well. She states that she was not able to make her follow up due to moving and she now lives 2 hours away.  Notified I did not see any follow up testing in Dr.Hochrein's notes but that I would route to him to make sure. Pt also reporting that she has numbness in the left side of her body at times and that she can be sitting on her bed an her left leg will go numb. She states that she can look at her right arm and leg and she can see all the veins and tense them but the left side it is more limp and numb. Pt denies chest pain, and sob. Notified if she begins to have those symptoms,chest pain or shortness of breath, to seek immediate evaluation at her nearest ED. Pt verbalized understanding. Able to schedule pt for an OV with Dr.Hochrein on 10/02/19 at 1:40pm for 12 mo follow up and for evaluation of numbness. Pt verbalized understanding and had no other questions at this time. Will route to MD to make aware.

## 2019-09-27 NOTE — Telephone Encounter (Signed)
She should have a repeat echo.  Since she lives two hours away can we ask them to please squeeze this in the day she sees me.  Preferably before she comes to see me.

## 2019-09-28 NOTE — Telephone Encounter (Signed)
Spoke with patient. Informed her that Dr. Antoine Poche wants a repeat echocardiogram before her appointment on Friday if at all possible. Order placed and sent to scheduling.

## 2019-09-28 NOTE — Addendum Note (Signed)
Addended by: Teressa Senter on: 09/28/2019 08:15 AM   Modules accepted: Orders

## 2019-10-01 DIAGNOSIS — Z7189 Other specified counseling: Secondary | ICD-10-CM | POA: Insufficient documentation

## 2019-10-01 DIAGNOSIS — I517 Cardiomegaly: Secondary | ICD-10-CM | POA: Insufficient documentation

## 2019-10-01 DIAGNOSIS — Z720 Tobacco use: Secondary | ICD-10-CM | POA: Insufficient documentation

## 2019-10-01 NOTE — Progress Notes (Deleted)
Cardiology Office Note   Date:  10/01/2019   ID:  [name], DOB 10/16/1975, MRN [id number]  PCP:  System, Provider Not In  Cardiologist:   No primary care provider on file. Referring:  Wanda Churches, FNP   No chief complaint on file.   History of Present Illness: Wanda Montgomery is a 44 y.o. female who was referred by Wanda Montgomery for evaluation of chest pain.    After the first visit I sent her for an echo which showed moderate LVH and a low normal EF.  POET (Plain Old Exercise Treadmill) demonstrated no ischemia or infarct.   She had a moderate exercise tolerance.  Follow up echo demonstrated preserved EF and mild LVH.  ***  Since I last saw her she has done better.  She has been working 2 jobs.  She is changed her diet.  She is lost about 15 pounds.  She feels like she is not having the chest discomfort she was having previously.  She is not having any new shortness of breath, PND or orthopnea.  She is had no edema.  Past Medical History:  Diagnosis Date  . Asthma   . Chronic back pain   . Hypothyroid   . Migraine headache     Past Surgical History:  Procedure Laterality Date  . TUBAL LIGATION       Current Outpatient Medications  Medication Sig Dispense Refill  . albuterol (PROVENTIL HFA;VENTOLIN HFA) 108 (90 Base) MCG/ACT inhaler INHALE 2 PUFFS PO Q 6 H PRN  1  . cyclobenzaprine (FLEXERIL) 5 MG tablet Take by mouth.    . gabapentin (NEURONTIN) 300 MG capsule Take 300 mg by mouth 3 (three) times daily.    Marland Kitchen levothyroxine (SYNTHROID, LEVOTHROID) 50 MCG tablet Take 50 mcg by mouth daily before breakfast.    . predniSONE (STERAPRED UNI-PAK 21 TAB) 10 MG (21) TBPK tablet Take by mouth daily. Take 6 tabs by mouth daily  for 2 days, then 5 tabs for 2 days, then 4 tabs for 2 days, then 3 tabs for 2 days, 2 tabs for 2 days, then 1 tab by mouth daily for 2 days 42 tablet 0   No current facility-administered medications for this visit.    Allergies:    Amoxicillin    ROS:  Please see the history of present illness.   Otherwise, review of systems are positive for ***.   All other systems are reviewed and negative.    PHYSICAL EXAM: VS:  There were no vitals taken for this visit. , BMI There is no height or weight on file to calculate BMI.  GENERAL:  Well appearing NECK:  No jugular venous distention, waveform within normal limits, carotid upstroke brisk and symmetric, no bruits, no thyromegaly LUNGS:  Clear to auscultation bilaterally CHEST:  Unremarkable HEART:  PMI not displaced or sustained,S1 and S2 within normal limits, no S3, no S4, no clicks, no rubs, *** murmurs ABD:  Flat, positive bowel sounds normal in frequency in pitch, no bruits, no rebound, no guarding, no midline pulsatile mass, no hepatomegaly, no splenomegaly EXT:  2 plus pulses throughout, no edema, no cyanosis no clubbing    ***GENERAL:  Well appearing NECK:  No jugular venous distention, waveform within normal limits, carotid upstroke brisk and symmetric, no bruits, no thyromegaly LUNGS:  Clear to auscultation bilaterally CHEST:  Unremarkable HEART:  PMI not displaced or sustained,S1 and S2 within normal limits, no S3, no S4, no clicks, no rubs, no  murmurs ABD:  Flat, positive bowel sounds normal in frequency in pitch, no bruits, no rebound, no guarding, no midline pulsatile mass, no hepatomegaly, no splenomegaly EXT:  2 plus pulses throughout, no edema, no cyanosis no clubbing   EKG:  EKG is *** ordered today. Sinus rhythm, rate *** , RSR prime V1 and V2, nonspecific ST-T wave changes.   Recent Labs: No results found for requested labs within last 8760 hours.    Lipid Panel No results found for: CHOL, TRIG, HDL, CHOLHDL, VLDL, LDLCALC, LDLDIRECT    Wt Readings from Last 3 Encounters:  05/09/18 233 lb 12.8 oz (106.1 kg)  03/31/18 233 lb (105.7 kg)  10/25/17 246 lb 12.8 oz (111.9 kg)      Other studies Reviewed: Additional studies/ records that were  reviewed today include: *** Review of the above records demonstrates:    ***   ASSESSMENT AND PLAN:  CHEST PAIN:   *** POET (Plain Old Exercise Treadmill) was negative.  Her chest pain is atypical and now seems to be much less bothersome.  No further work-up is suggested.   TOBACCO:    ***  he is down to half pack a day and we discussed continuing to reduce this.   ABNORMAL ECHO:  ***   EF is now improved.  LVH is mild.  Given the improvement hold off on further testing but will repeat an echocardiogram in 1 year.  She is going to concentrate on weight loss diet and exercise.  If she has any increasing symptoms she will let me know.  HYPOTHYROID:  *** This has been titrated recently.    COVID EDUCATION:  ***    Current medicines are reviewed at length with the patient today.  The patient does not have concerns regarding medicines.  The following changes have been made:  None  Labs/ tests ordered today include:    No orders of the defined types were placed in this encounter.    Disposition:   FU with me in one year.    Signed, Minus Breeding, MD  10/01/2019 8:30 PM    [place]

## 2019-10-02 ENCOUNTER — Telehealth: Payer: Self-pay | Admitting: *Deleted

## 2019-10-02 ENCOUNTER — Ambulatory Visit: Payer: Self-pay | Admitting: Cardiology

## 2019-10-02 ENCOUNTER — Telehealth: Payer: Self-pay | Admitting: Cardiology

## 2019-10-02 ENCOUNTER — Ambulatory Visit (HOSPITAL_COMMUNITY): Admission: RE | Admit: 2019-10-02 | Payer: Self-pay | Source: Ambulatory Visit

## 2019-10-02 NOTE — Telephone Encounter (Signed)
Spoke with patient regarding new appointment for Echo and appointment with Dr. Judene Companion is scheduled Friday 10/09/19 at [facility]--arrival time is 12:30 pm thru Entrance "C"---3:20 pm appointment with Dr. Antoine Poche.  Patient voiced her understanding and I will mail the information to her.

## 2019-10-02 NOTE — Telephone Encounter (Signed)
Patient was scheduled today for an Echocardiogram at [facility] at 9:00 am and a follow up with Dr. Antoine Poche at 10:40 am.  Patient called very upset because she missed her appointments--patient lived in the [place], [place] area.  States she is having shortness of breath and some chest pain and wanted to be seen today.  I spoke with Dr. Jenene Slicker nurse Madelin Rear, RN and was instructed to tell the patient due to her symptoms she should go to the neareest ED.  Patient stated she was not going to any ED and wanted to reschedule her appointments.

## 2019-10-08 DIAGNOSIS — E039 Hypothyroidism, unspecified: Secondary | ICD-10-CM | POA: Insufficient documentation

## 2019-10-08 DIAGNOSIS — R931 Abnormal findings on diagnostic imaging of heart and coronary circulation: Secondary | ICD-10-CM | POA: Insufficient documentation

## 2019-10-08 NOTE — Progress Notes (Signed)
Cardiology Office Note   Date:  10/09/2019   ID:  Wanda Montgomery, DOB 1976-03-08, MRN [id number]  PCP:  System, Provider Not In  Cardiologist:   No primary care provider on file. Referring:  Ezra Sites, FNP   Chief Complaint  Patient presents with  . Chest Pain    History of Present Illness: Wanda Montgomery is a 44 y.o. female who was referred by Ezra Sites for evaluation of chest pain.    After the first visit I sent her for an echo which showed moderate LVH and a low normal EF.  POET (Plain Old Exercise Treadmill) demonstrated no ischemia or infarct.   She had a moderate exercise tolerance.  Follow up echo demonstrated preserved EF and mild LVH.    She returns today and had an echo which preliminarily looks like her ejection fraction is still normal and mild LVH.  Unfortunately she continues to get left arm pain.  She gets some chest pain.  This happens with emotional stress.  It is substernal.  It seems to be sharp.  It lasts until she gets out of the stressful situation.  She has had some shortness of breath.  This is been a little less than previous and she is not describing PND or orthopnea.  She is not having any palpitations, presyncope or syncope.  Has had no weight gain or edema.  She is unfortunately still smoking cigarettes.  Past Medical History:  Diagnosis Date  . Asthma   . Chronic back pain   . Hypothyroid   . Migraine headache     Past Surgical History:  Procedure Laterality Date  . TUBAL LIGATION       Current Outpatient Medications  Medication Sig Dispense Refill  . albuterol (PROVENTIL HFA;VENTOLIN HFA) 108 (90 Base) MCG/ACT inhaler INHALE 2 PUFFS PO Q 6 H PRN  1  . cyclobenzaprine (FLEXERIL) 5 MG tablet Take by mouth.    . gabapentin (NEURONTIN) 300 MG capsule Take 300 mg by mouth 3 (three) times daily.    Marland Kitchen levothyroxine (SYNTHROID, LEVOTHROID) 50 MCG tablet Take 50 mcg by mouth daily before breakfast.    . predniSONE (STERAPRED  UNI-PAK 21 TAB) 10 MG (21) TBPK tablet Take by mouth daily. Take 6 tabs by mouth daily  for 2 days, then 5 tabs for 2 days, then 4 tabs for 2 days, then 3 tabs for 2 days, 2 tabs for 2 days, then 1 tab by mouth daily for 2 days 42 tablet 0  . metoprolol tartrate (LOPRESSOR) 100 MG tablet Take 1 tablet (100 mg total) by mouth once for 1 dose. TAKE 2 HOURS BEFORE YOUR CT SCAN 1 tablet 0   No current facility-administered medications for this visit.    Allergies:   Amoxicillin    ROS:  Please see the history of present illness.   Otherwise, review of systems are positive for none.   All other systems are reviewed and negative.    PHYSICAL EXAM: VS:  BP 134/82 (BP Location: Left Arm, Patient Position: Sitting, Cuff Size: Normal)   Pulse 91   Temp 97.7 F (36.5 C)   Ht 5\' 5"  (1.651 m)   Wt 207 lb (93.9 kg)   BMI 34.45 kg/m  , BMI Body mass index is 34.45 kg/m.  GENERAL:  Well appearing NECK:  No jugular venous distention, waveform within normal limits, carotid upstroke brisk and symmetric, no bruits, no thyromegaly LUNGS:  Clear to auscultation bilaterally CHEST:  Unremarkable  HEART:  PMI not displaced or sustained,S1 and S2 within normal limits, no S3, no S4, no clicks, no rubs, no murmurs ABD:  Flat, positive bowel sounds normal in frequency in pitch, no bruits, no rebound, no guarding, no midline pulsatile mass, no hepatomegaly, no splenomegaly EXT:  2 plus pulses throughout, no edema, no cyanosis no clubbing    EKG:  EKG is  ordered today. Sinus rhythm, rate 91 , RSR prime V1 and V2, nonspecific ST-T wave changes.  No change from previous   Recent Labs: No results found for requested labs within last 8760 hours.    Lipid Panel No results found for: CHOL, TRIG, HDL, CHOLHDL, VLDL, LDLCALC, LDLDIRECT    Wt Readings from Last 3 Encounters:  10/09/19 207 lb (93.9 kg)  05/09/18 233 lb 12.8 oz (106.1 kg)  03/31/18 233 lb (105.7 kg)      Other studies Reviewed: Additional  studies/ records that were reviewed today include: None Review of the above records demonstrates:    NA   ASSESSMENT AND PLAN:  CHEST PAIN:    Given the fact that she has ongoing symptoms and significant risk factors we need to do further testing to exclude obstructive coronary disease.  She will come back for a coronary CTA.   TOBACCO:    We have discussed the importance of not smoking.  ABNORMAL ECHO: I will follow up the final results of the echo today.  However, not suspect any change.  No change in therapy.  HYPOTHYROID: I will have the recent labs but she said she has had this adjusted.   OBESITY: She has lost about 30 pounds she says through stress and her diet.  I encouraged continued slow weight loss.  COVID EDUCATION: She absolutely would not want the vaccine.  We talked about this.   Current medicines are reviewed at length with the patient today.  The patient does not have concerns regarding medicines.  The following changes have been made:  None  Labs/ tests ordered today include:    Orders Placed This Encounter  Procedures  . CT CORONARY MORPH W/CTA COR W/SCORE W/CA W/CM &/OR WO/CM  . CT CORONARY FRACTIONAL FLOW RESERVE DATA PREP  . CT CORONARY FRACTIONAL FLOW RESERVE FLUID ANALYSIS  . Lipid panel  . EKG 12-Lead     Disposition:   FU with me as needed.    Signed, Rollene Rotunda, MD  10/09/2019 5:37 PM    [facility]

## 2019-10-09 ENCOUNTER — Ambulatory Visit (INDEPENDENT_AMBULATORY_CARE_PROVIDER_SITE_OTHER): Payer: Self-pay | Admitting: Cardiology

## 2019-10-09 ENCOUNTER — Ambulatory Visit (HOSPITAL_COMMUNITY)
Admission: RE | Admit: 2019-10-09 | Discharge: 2019-10-09 | Disposition: A | Discharge status: Home / Self Care | Payer: Self-pay | Source: Ambulatory Visit | Attending: Cardiology | Admitting: Cardiology

## 2019-10-09 ENCOUNTER — Other Ambulatory Visit: Payer: Self-pay

## 2019-10-09 ENCOUNTER — Encounter: Payer: Self-pay | Admitting: Cardiology

## 2019-10-09 VITALS — BP 134/82 | HR 91 | Temp 97.7°F | Ht 65.0 in | Wt 207.0 lb

## 2019-10-09 DIAGNOSIS — R072 Precordial pain: Secondary | ICD-10-CM

## 2019-10-09 DIAGNOSIS — Z7189 Other specified counseling: Secondary | ICD-10-CM

## 2019-10-09 DIAGNOSIS — E039 Hypothyroidism, unspecified: Secondary | ICD-10-CM

## 2019-10-09 DIAGNOSIS — I517 Cardiomegaly: Secondary | ICD-10-CM

## 2019-10-09 DIAGNOSIS — F17200 Nicotine dependence, unspecified, uncomplicated: Secondary | ICD-10-CM | POA: Insufficient documentation

## 2019-10-09 DIAGNOSIS — R931 Abnormal findings on diagnostic imaging of heart and coronary circulation: Secondary | ICD-10-CM

## 2019-10-09 DIAGNOSIS — E785 Hyperlipidemia, unspecified: Secondary | ICD-10-CM

## 2019-10-09 DIAGNOSIS — R0602 Shortness of breath: Secondary | ICD-10-CM | POA: Insufficient documentation

## 2019-10-09 DIAGNOSIS — Z720 Tobacco use: Secondary | ICD-10-CM

## 2019-10-09 MED ORDER — METOPROLOL TARTRATE 100 MG PO TABS
100.0000 mg | ORAL_TABLET | Freq: Once | ORAL | 0 refills | Status: DC
Start: 2019-10-09 — End: 2020-10-31

## 2019-10-09 MED ORDER — METOPROLOL TARTRATE 100 MG PO TABS
100.0000 mg | ORAL_TABLET | Freq: Once | ORAL | 0 refills | Status: DC
Start: 2019-10-09 — End: 2019-10-09

## 2019-10-09 NOTE — Patient Instructions (Addendum)
Medication Instructions:  TAKE 100MG  OF METOPROLOL 2 HOURS BEFORE YOUR CTA *If you need a refill on your cardiac medications before your next appointment, please call your pharmacy*  Lab Work: Your physician recommends that you return for lab work WHEN YOU RETURN FOR YOUR CTA  Testing/Procedures: CORONARY CTA  Follow-Up: At [facility], you and your health needs are our priority.  As part of our continuing mission to provide you with exceptional heart care, we have created designated Provider Care Teams.  These Care Teams include your primary Cardiologist (physician) and Advanced Practice Providers (APPs -  Physician Assistants and Nurse Practitioners) who all work together to provide you with the care you need, when you need it.   Your next appointment:   FOLLOW UP AS NEEDED  Other Instructions Your cardiac CT will be scheduled at the below locations:   [facility] [address] [place], [place] [phone]  If scheduled at [facility], please arrive at the [facility] main entrance of [facility] 30 minutes prior to test start time. Proceed to the [facility] Radiology Department (first floor) to check-in and test prep.  Please follow these instructions carefully (unless otherwise directed):   On the Night Before the Test: . Be sure to Drink plenty of water. . Do not consume any caffeinated/decaffeinated beverages or chocolate 12 hours prior to your test. . Do not take any antihistamines 12 hours prior to your test.  On the Day of the Test: . Drink plenty of water. Do not drink any water within one hour of the test. . Do not eat any food 4 hours prior to the test. . You may take your regular medications prior to the test.  . Take metoprolol (Lopressor) 100MG  two hours prior to test. . HOLD Furosemide/Hydrochlorothiazide morning of the test. . FEMALES- please wear underwire-free bra if available       After the Test: . Drink  plenty of water. . After receiving IV contrast, you may experience a mild flushed feeling. This is normal. . On occasion, you may experience a mild rash up to 24 hours after the test. This is not dangerous. If this occurs, you can take Benadryl 25 mg and increase your fluid intake. . If you experience trouble breathing, this can be serious. If it is severe call 911 IMMEDIATELY. If it is mild, please call our office. . If you take any of these medications: Glipizide/Metformin, Avandament, Glucavance, please do not take 48 hours after completing test unless otherwise instructed.   Once we have confirmed authorization from your insurance company, we will call you to set up a date and time for your test.   For non-scheduling related questions, please contact the cardiac imaging nurse navigator should you have any questions/concerns: [facility], RN Navigator Cardiac Imaging Heart and Vascular Services [phone] office  For scheduling needs, including cancellations and rescheduling, please call [phone].

## 2019-10-09 NOTE — Progress Notes (Signed)
  Echocardiogram 2D Echocardiogram has been performed.  Wanda Montgomery 10/09/2019, 1:52 PM

## 2019-10-26 ENCOUNTER — Other Ambulatory Visit: Payer: Self-pay

## 2019-10-26 DIAGNOSIS — R072 Precordial pain: Secondary | ICD-10-CM

## 2019-10-26 DIAGNOSIS — R0602 Shortness of breath: Secondary | ICD-10-CM

## 2019-10-26 NOTE — Progress Notes (Signed)
Order placed for [facility] per Dr. Antoine Poche.    Rollene Rotunda, MD  Teressa Senter, RN  We can cancel the CT and we can schedule a Lexiscan Myoview.    Message sent to scheduling to schedule lexiscan.

## 2019-10-30 ENCOUNTER — Telehealth: Payer: Self-pay | Admitting: Cardiology

## 2019-10-30 NOTE — Telephone Encounter (Signed)
Spoke with the pt and advised her she does not take the lopressor prior to her Nuc Med stress test.. it was for her CT that has been cancelled and she can discard it.

## 2019-10-30 NOTE — Telephone Encounter (Signed)
° °  Pt c/o medication issue:  1. Name of Medication:   metoprolol tartrate (LOPRESSOR) 100 MG tablet    2. How are you currently taking this medication (dosage and times per day)?  Take 1 tablet (100 mg total) by mouth once for 1 dose. TAKE 2 HOURS BEFORE YOUR CT SCAN  3. Are you having a reaction (difficulty breathing--STAT)?   4. What is your medication issue? Pt would like to clarify if she needs to take this prior her stress test on 11/06/19

## 2019-11-04 ENCOUNTER — Telehealth (HOSPITAL_COMMUNITY): Payer: Self-pay

## 2019-11-04 NOTE — Telephone Encounter (Signed)
Encounter complete. 

## 2019-11-06 ENCOUNTER — Ambulatory Visit (HOSPITAL_COMMUNITY)
Admission: RE | Admit: 2019-11-06 | Discharge: 2019-11-06 | Disposition: A | Discharge status: Home / Self Care | Payer: Self-pay | Source: Ambulatory Visit | Attending: Internal Medicine | Admitting: Internal Medicine

## 2019-11-06 ENCOUNTER — Other Ambulatory Visit: Payer: Self-pay

## 2019-11-06 DIAGNOSIS — R0602 Shortness of breath: Secondary | ICD-10-CM | POA: Insufficient documentation

## 2019-11-06 DIAGNOSIS — R072 Precordial pain: Secondary | ICD-10-CM | POA: Insufficient documentation

## 2019-11-06 LAB — MYOCARDIAL PERFUSION IMAGING
LV dias vol: 118 mL (ref 46–106)
LV sys vol: 46 mL
Peak HR: 89 {beats}/min
Rest HR: 55 {beats}/min
SDS: 3
SRS: 0
SSS: 3
TID: 1.2

## 2019-11-06 MED ORDER — TECHNETIUM TC 99M TETROFOSMIN IV KIT
29.9000 | PACK | Freq: Once | INTRAVENOUS | Status: AC | PRN
  Administered 2019-11-06: 29.9 via INTRAVENOUS
  Filled 2019-11-06: qty 30

## 2019-11-06 MED ORDER — TECHNETIUM TC 99M TETROFOSMIN IV KIT
9.6000 | PACK | Freq: Once | INTRAVENOUS | Status: AC | PRN
  Administered 2019-11-06: 9.6 via INTRAVENOUS
  Filled 2019-11-06: qty 10

## 2019-11-06 MED ORDER — REGADENOSON 0.4 MG/5ML IV SOLN
0.4000 mg | Freq: Once | INTRAVENOUS | Status: AC
Start: 2019-11-06 — End: 2019-11-06
  Administered 2019-11-06: 0.4 mg via INTRAVENOUS

## 2019-11-10 ENCOUNTER — Telehealth: Payer: Self-pay | Admitting: Cardiology

## 2019-11-10 NOTE — Telephone Encounter (Signed)
New Message   Pt is returning call  Call transferred to [facility]

## 2019-12-18 ENCOUNTER — Encounter: Payer: Self-pay | Admitting: Physician Assistant

## 2019-12-18 NOTE — Progress Notes (Signed)
This encounter was created in error - please disregard.

## 2020-10-17 ENCOUNTER — Other Ambulatory Visit (HOSPITAL_COMMUNITY): Payer: Self-pay | Admitting: Neurology

## 2020-10-17 DIAGNOSIS — M5459 Other low back pain: Secondary | ICD-10-CM

## 2020-10-27 ENCOUNTER — Encounter: Payer: Self-pay | Admitting: Physician Assistant

## 2020-10-29 NOTE — Progress Notes (Signed)
Cardiology Office Note   Date:  10/31/2020   ID:  Avila Albritton, DOB [birth year], MRN [id number]  PCP:  Rebekah Chesterfield, NP  Cardiologist:   Rollene Rotunda, MD Referring:  Riley Churches, FNP   Chief Complaint  Patient presents with  . Arm Pain    History of Present Illness: Wanda Montgomery is a 45 y.o. female who was referred by Riley Churches for evaluation of chest pain.    After the first visit I sent her for an echo which showed moderate LVH and a low normal EF.  POET (Plain Old Exercise Treadmill) demonstrated no ischemia or infarct.   She had a moderate exercise tolerance.  She had a follow-up perfusion study which was low risk.  There was a question of a small apical defect but this was thought to be artifact.   Follow up echo demonstrated preserved EF and mild LVH.  However, follow-up echo did not suggest LVH.  At the last visit she was having some chest pain.  She was to have a coronary CTA.   However, she was not able to hold the cigarettes long enough to actually go through the CT procedure.  Therefore, we canceled this.  She returns now and she is not really describing any chest pressure, neck or arm discomfort.  She is not describing the left arm discomfort she was having.  She is currently having right arm discomfort.  She has some numbness and tingling down into her hand.  She is also having numbness and tingling in her legs and is getting her lumbar spine looked at.  She is continue to have a lot of stress at home.  She does get bit by her dog.  She has had to have limitations because of this.   Past Medical History:  Diagnosis Date  . Asthma   . Chronic back pain   . Hypothyroid   . Migraine headache     Past Surgical History:  Procedure Laterality Date  . TUBAL LIGATION       Current Outpatient Medications  Medication Sig Dispense Refill  . albuterol (PROVENTIL HFA;VENTOLIN HFA) 108 (90 Base) MCG/ACT inhaler INHALE 2 PUFFS PO Q 6 H PRN  1  .  cyclobenzaprine (FLEXERIL) 5 MG tablet Take by mouth.    . gabapentin (NEURONTIN) 600 MG tablet gabapentin 600 mg tablet  Take 1 tablet 3 times a day by oral route.    Marland Kitchen levothyroxine (SYNTHROID) 100 MCG tablet levothyroxine 100 mcg tablet  Take 1 tablet every day by oral route.    Marland Kitchen oxyCODONE-acetaminophen (PERCOCET/ROXICET) 5-325 MG tablet Percocet 5 mg-325 mg tablet  Take 1 tablet twice a day by oral route as needed.    . vitamin E 180 MG (400 UNITS) capsule vitamin E (dl, acetate) 427 mg (062 unit) capsule  Take by oral route.     No current facility-administered medications for this visit.    Allergies:   Amoxicillin    ROS:  Please see the history of present illness.   Otherwise, review of systems are positive for none.   All other systems are reviewed and negative.    PHYSICAL EXAM: VS:  BP 114/78   Pulse 79   Ht 5\' 5"  (1.651 m)   Wt 227 lb (103 kg)   SpO2 97%   BMI 37.77 kg/m  , BMI Body mass index is 37.77 kg/m.  GENERAL:  Well appearing NECK:  No jugular venous distention, waveform within normal limits,  carotid upstroke brisk and symmetric, no bruits, no thyromegaly LUNGS:  Clear to auscultation bilaterally CHEST:  Unremarkable HEART:  PMI not displaced or sustained,S1 and S2 within normal limits, no S3, no S4, no clicks, no rubs, no murmurs ABD:  Flat, positive bowel sounds normal in frequency in pitch, no bruits, no rebound, no guarding, no midline pulsatile mass, no hepatomegaly, no splenomegaly EXT:  2 plus pulses throughout, no edema, no cyanosis no clubbing   EKG:  EKG is  ordered today. Sinus rhythm, rate 79, RSR prime V1 and V2, nonspecific ST-T wave changes.  Low voltage in the limb in chest leads seen no change from previous   Recent Labs: No results found for requested labs within last 8760 hours.    Lipid Panel No results found for: CHOL, TRIG, HDL, CHOLHDL, VLDL, LDLCALC, LDLDIRECT    Wt Readings from Last 3 Encounters:  10/31/20 227 lb (103  kg)  11/06/19 207 lb (93.9 kg)  10/09/19 207 lb (93.9 kg)      Other studies Reviewed: Additional studies/ records that were reviewed today include: Lexiscan Myoview. Review of the above records demonstrates:    NA   ASSESSMENT AND PLAN:  ARM PAIN:      She is no longer having any chest pain.  Her right arm discomfort is a tingling and numbness in her fingers that is not typical for angina.  She had a very low risk perfusion study less than a year ago.  No further work-up is suggested.  She needs primary risk reduction.   TOBACCO:   We talked about the need to stop smoking.  She does not want any therapies for this.   HYPOTHYROID:   This is being managed by her primary provider.  No change in therapy.  OBESITY:    She has lost weight.  She gained some of that back and we again talked about carbohydrate restriction.    Current medicines are reviewed at length with the patient today.  The patient does not have concerns regarding medicines.  The following changes have been made:  None  Labs/ tests ordered today include:      Orders Placed This Encounter  Procedures  . EKG 12-Lead     Disposition:   FU with me as needed.      Signed, Rollene Rotunda, MD  10/31/2020 7:56 PM    [facility]

## 2020-10-31 ENCOUNTER — Ambulatory Visit (INDEPENDENT_AMBULATORY_CARE_PROVIDER_SITE_OTHER): Payer: 59 | Admitting: Cardiology

## 2020-10-31 ENCOUNTER — Other Ambulatory Visit: Payer: Self-pay

## 2020-10-31 ENCOUNTER — Ambulatory Visit (HOSPITAL_COMMUNITY)
Admission: RE | Admit: 2020-10-31 | Discharge: 2020-10-31 | Disposition: A | Discharge status: Home / Self Care | Payer: 59 | Source: Ambulatory Visit | Attending: Neurology | Admitting: Neurology

## 2020-10-31 ENCOUNTER — Encounter: Payer: Self-pay | Admitting: Cardiology

## 2020-10-31 VITALS — BP 114/78 | HR 79 | Ht 65.0 in | Wt 227.0 lb

## 2020-10-31 DIAGNOSIS — Z720 Tobacco use: Secondary | ICD-10-CM | POA: Diagnosis not present

## 2020-10-31 DIAGNOSIS — R072 Precordial pain: Secondary | ICD-10-CM | POA: Diagnosis not present

## 2020-10-31 DIAGNOSIS — E039 Hypothyroidism, unspecified: Secondary | ICD-10-CM

## 2020-10-31 DIAGNOSIS — M5459 Other low back pain: Secondary | ICD-10-CM | POA: Insufficient documentation

## 2020-10-31 NOTE — Patient Instructions (Signed)
Medication Instructions:  ?Your physician recommends that you continue on your current medications as directed. Please refer to the Current Medication list given to you today.  ? ?Labwork: ?NONE ? ?Testing/Procedures: ?NONE ? ?Follow-Up: ?AS NEEDED  ? ?  ?

## 2020-11-28 ENCOUNTER — Encounter: Payer: Self-pay | Admitting: Physician Assistant

## 2020-11-28 ENCOUNTER — Ambulatory Visit (INDEPENDENT_AMBULATORY_CARE_PROVIDER_SITE_OTHER): Payer: 59 | Admitting: Physician Assistant

## 2020-11-28 VITALS — BP 124/80 | HR 91 | Ht 65.0 in | Wt 235.0 lb

## 2020-11-28 DIAGNOSIS — Z1211 Encounter for screening for malignant neoplasm of colon: Secondary | ICD-10-CM

## 2020-11-28 DIAGNOSIS — R140 Abdominal distension (gaseous): Secondary | ICD-10-CM | POA: Diagnosis not present

## 2020-11-28 DIAGNOSIS — R1030 Lower abdominal pain, unspecified: Secondary | ICD-10-CM | POA: Diagnosis not present

## 2020-11-28 DIAGNOSIS — R194 Change in bowel habit: Secondary | ICD-10-CM | POA: Diagnosis not present

## 2020-11-28 MED ORDER — DICYCLOMINE HCL 10 MG PO CAPS: 10.0000 mg | ORAL_CAPSULE | Freq: Three times a day (TID) | ORAL | 3 refills | Status: AC | PRN

## 2020-11-28 MED ORDER — PEG-KCL-NACL-NASULF-NA ASC-C 100 G PO SOLR: 1.0000 | Freq: Once | ORAL | 0 refills | Status: AC

## 2020-11-28 NOTE — Progress Notes (Signed)
Subjective:    Patient ID: Wanda Montgomery, female    DOB: 16-Apr-1976, 45 y.o.   MRN: [id number]  HPI Wanda Montgomery is a 45 year old white female, new to GI today referred by Dr. Sharyn Lull Marione/OB/GYN for screening colonoscopy.  Patient also has complaints of lower abdominal discomfort and bloating/gas. She has not had any prior GI evaluation. She has been dealing with a dog bite in her right upper extremity over the past few months.  She has had to have surgical procedures and antibiotics.  She is on chronic opioids for back pain. Family history is negative for colon cancer's and polyps as far she is aware. She says she has had some problems with pelvic pain over the past 4 to 5 years which comes and goes.  She describes this as cramping and sharp at times.  She had recent pelvic ultrasound which was negative.  She is currently complaining of some alternating bowel habits with constipation alternating with looser stools.  She says she is over the past several months she has been bloated all of the time and has had a lot of gas and flatus which has been malodorous. She does not believe that she is lactose intolerant.  She is not using any artificial sweeteners.  She has been trying to eat more healthy over the past few months, and this may be contributing to bloating and gas. She has also gained weight about 30 pounds over the past year.  She is status post bilateral tubal ligation and had an episode of cellulitis in the suprapubic area remotely.   Review of Systems Pertinent positive and negative review of systems were noted in the above HPI section.  All other review of systems was otherwise negative.   Outpatient Encounter Medications as of 11/28/2020  Medication Sig   albuterol (PROVENTIL HFA;VENTOLIN HFA) 108 (90 Base) MCG/ACT inhaler INHALE 2 PUFFS PO Q 6 H PRN   cyclobenzaprine (FLEXERIL) 5 MG tablet Take by mouth.   dicyclomine (BENTYL) 10 MG capsule Take 1 capsule (10 mg total) by mouth  3 (three) times daily as needed for spasms.   gabapentin (NEURONTIN) 600 MG tablet gabapentin 600 mg tablet  Take 1 tablet 3 times a day by oral route.   levothyroxine (SYNTHROID) 100 MCG tablet levothyroxine 100 mcg tablet  Take 1 tablet every day by oral route.   oxyCODONE-acetaminophen (PERCOCET/ROXICET) 5-325 MG tablet Percocet 5 mg-325 mg tablet  Take 1 tablet twice a day by oral route as needed.   peg 3350 powder (MOVIPREP) 100 g SOLR Take 1 kit (200 g total) by mouth once for 1 dose.   vitamin E 180 MG (400 UNITS) capsule vitamin E (dl, acetate) 180 mg (400 unit) capsule  Take by oral route.   No facility-administered encounter medications on file as of 11/28/2020.   Allergies  Allergen Reactions   Amoxicillin Rash   Patient Active Problem List   Diagnosis Date Noted   Hypothyroidism 10/08/2019   Abnormal echocardiogram 10/08/2019   Tobacco abuse 10/01/2019   LVH (left ventricular hypertrophy) 10/01/2019   Educated about COVID-19 virus infection 10/01/2019   SOB (shortness of breath) 09/24/2017   Abnormal EKG 09/24/2017   Chest pain    Social History   Socioeconomic History   Marital status: Divorced    Spouse name: Not on file   Number of children: Not on file   Years of education: Not on file   Highest education level: Not on file  Occupational History   Not  on file  Tobacco Use   Smoking status: Every Day    Packs/day: 0.50    Pack years: 0.00    Types: Cigarettes   Smokeless tobacco: Never  Vaping Use   Vaping Use: Never used  Substance and Sexual Activity   Alcohol use: Yes   Drug use: No   Sexual activity: Yes    Birth control/protection: Surgical  Other Topics Concern   Not on file  Social History Narrative   Lives with fiance and 19 year old son.     Social Determinants of Health   Financial Resource Strain: Not on file  Food Insecurity: Not on file  Transportation Needs: Not on file  Physical Activity: Not on file  Stress: Not on file   Social Connections: Not on file  Intimate Partner Violence: Not on file    [name]'s family history includes Pneumonia in her mother.      Objective:    Vitals:   11/28/20 1014  BP: 124/80  Pulse: 91  SpO2: 97%    Physical Exam. Well-developed well-nourished  WF , obese, in no acute distress.  Height, Weight,235  BMI39  HEENT; nontraumatic normocephalic, EOMI, PE R LA, sclera anicteric. Oropharynx; not examined today Neck; supple, no JVD Cardiovascular; regular rate and rhythm with S1-S2, no murmur rub or gallop Pulmonary; Clear bilaterally Abdomen; soft, obese, no focal tenderness nondistended, no palpable mass or hepatosplenomegaly, bowel sounds are active Rectal; not done today Skin; benign exam, no jaundice rash or appreciable lesions Extremities; no clubbing cyanosis or edema skin warm and dry-right upper extremity in a sling Neuro/Psych; alert and oriented x4, grossly nonfocal mood and affect appropriate        Assessment & Plan:   #1 45-year-old female approaching 45th birthday next week referred for screening colonoscopy, average risk #2 chronic intermittent pelvic pain over the past 4 to 5 years, reported negative GYN evaluation/negative pelvic ultrasound patient describes the pain as crampy and sharp. #3.  3 to 4-month history of abdominal bloating and gas with alternating bowel habits Etiology not certain, weight gain may be contributing to some of the sense of abdominal bloating.  Rule out IBS, consider SIBO  #4 recent dog bite right upper extremity for which she has required antibiotics and surgery #5 chronic opioid use  Plan; Patient will be scheduled for colonoscopy with Dr. Beavers.  Procedure was discussed in detail with the patient including indications risks and benefits and she is agreeable to proceed. Start trial of Benefiber 1 scoop daily in a glass of water Low gas diet Start trial of Culturelle 1 p.o. daily x3 months Start trial of Bentyl  10 mg p.o. 3 times daily as needed. Further recommendations pending results of colonoscopy and response to above. Amy S Esterwood PA-C 11/28/2020   Cc: Marinone, Michelle E, MD   

## 2020-11-28 NOTE — Patient Instructions (Signed)
If you are age 45 or younger, your body mass index should be between 19-25. Your Body mass index is 39.11 kg/m. If this is out of the aformentioned range listed, please consider follow up with your Primary Care Provider.  __________________________________________________________  The [place] providers would like to encourage you to use [facility] to communicate with providers for non-urgent requests or questions.  Due to long hold times on the telephone, sending your provider a message by [facility] may be a faster and more efficient way to get a response.  Please allow 48 business hours for a response.  Please remember that this is for non-urgent requests.   START Benefiber, 1 scoop daily in 8 ounces of water or juice  START Culture 1 capsule daily for the next 3 months  START Dicyclomine 10 mg 1 tablet three times a day as needed for abdominal pain/cramping  Follow a low gas diet  Avoid artificial sweeteners and carbonated beverages.  Follow up pending  Thank you for entrusting me with your care and choosing [facility].  Amy Esterwood, PA-C

## 2020-11-29 NOTE — Progress Notes (Signed)
Reviewed and agree with management plans. ? ?Anjannette L. Everitt Wenner, MD, MPH  ?

## 2020-12-12 ENCOUNTER — Encounter: Payer: 59 | Admitting: Gastroenterology

## 2020-12-21 ENCOUNTER — Ambulatory Visit (HOSPITAL_COMMUNITY): Payer: 59 | Attending: Neurology | Admitting: Physical Therapy

## 2021-01-20 ENCOUNTER — Ambulatory Visit (HOSPITAL_COMMUNITY): Payer: 59 | Attending: Neurology

## 2022-04-04 ENCOUNTER — Telehealth: Payer: Self-pay | Admitting: Cardiology

## 2022-04-04 NOTE — Telephone Encounter (Signed)
Returned the call to the patient. She stated that she had a colonoscopy last Thursday and since then she has been having intermittent chest pressure. She stated that this is not a pain but a pressure in the middle of her chest. This has happened two other times since last Thursday with sweating and nausea.  She called the office where she had the colonoscopy and they advised her to follow up with cardiology.   Appointment made with Dr. Percival Spanish on 10/27. ED precautions has been given for worsening chest pain.

## 2022-04-04 NOTE — Telephone Encounter (Signed)
Pt c/o of Chest Pain: STAT if CP now or developed within 24 hours  1. Are you having CP right now? No  2. Are you experiencing any other symptoms (ex. SOB, nausea, vomiting, sweating)? Nausea and sweating  3. How long have you been experiencing CP?  Last Thursday  4. Is your CP continuous or coming and going? Come and go  5. Have you taken Nitroglycerin? No   Pt states that she has been experiencing this every other day since last Thursday ?

## 2022-04-12 NOTE — Progress Notes (Deleted)
Cardiology Office Note   Date:  04/12/2022   ID:  Wanda Montgomery, DOB 05-01-1976, MRN [id number]  PCP:  Adaline Sill, NP  Cardiologist:   Minus Breeding, MD Referring:  Ezra Sites, FNP   No chief complaint on file.   History of Present Illness: Wanda Montgomery is a 46 y.o. female who was referred by Ezra Sites for evaluation of chest pain.    After the first visit I sent her for an echo which showed moderate LVH and a low normal EF.  POET (Plain Old Exercise Treadmill) demonstrated no ischemia or infarct.   She had a moderate exercise tolerance.  She had a follow-up perfusion study which was low risk.  There was a question of a small apical defect but this was thought to be artifact.   Follow up echo demonstrated preserved EF and mild LVH.  However, follow-up echo did not suggest LVH.  She called last week with chest pain. However, she could not tolerate going through this procedure.   Since I last saw her ***   *** At the last visit she was having some chest pain.  She was to have a coronary CTA.   However, she was not able to hold the cigarettes long enough to actually go through the CT procedure.  Therefore, we canceled this.  She returns now and she is not really describing any chest pressure, neck or arm discomfort.  She is not describing the left arm discomfort she was having.  She is currently having right arm discomfort.  She has some numbness and tingling down into her hand.  She is also having numbness and tingling in her legs and is getting her lumbar spine looked at.  She is continue to have a lot of stress at home.  She does get bit by her dog.  She has had to have limitations because of this.   Past Medical History:  Diagnosis Date   Asthma    Chronic back pain    Hypothyroid    Migraine headache     Past Surgical History:  Procedure Laterality Date   TUBAL LIGATION       Current Outpatient Medications  Medication Sig Dispense Refill    albuterol (PROVENTIL HFA;VENTOLIN HFA) 108 (90 Base) MCG/ACT inhaler INHALE 2 PUFFS PO Q 6 H PRN  1   cyclobenzaprine (FLEXERIL) 5 MG tablet Take by mouth.     dicyclomine (BENTYL) 10 MG capsule Take 1 capsule (10 mg total) by mouth 3 (three) times daily as needed for spasms. 90 capsule 3   gabapentin (NEURONTIN) 600 MG tablet gabapentin 600 mg tablet  Take 1 tablet 3 times a day by oral route.     levothyroxine (SYNTHROID) 100 MCG tablet levothyroxine 100 mcg tablet  Take 1 tablet every day by oral route.     oxyCODONE-acetaminophen (PERCOCET/ROXICET) 5-325 MG tablet Percocet 5 mg-325 mg tablet  Take 1 tablet twice a day by oral route as needed.     vitamin E 180 MG (400 UNITS) capsule vitamin E (dl, acetate) 180 mg (400 unit) capsule  Take by oral route.     No current facility-administered medications for this visit.    Allergies:   Amoxicillin    ROS:  Please see the history of present illness.   Otherwise, review of systems are positive for ***.   All other systems are reviewed and negative.    PHYSICAL EXAM: VS:  There were no vitals taken for  this visit. , BMI There is no height or weight on file to calculate BMI.  GENERAL:  Well appearing NECK:  No jugular venous distention, waveform within normal limits, carotid upstroke brisk and symmetric, no bruits, no thyromegaly LUNGS:  Clear to auscultation bilaterally CHEST:  Unremarkable HEART:  PMI not displaced or sustained,S1 and S2 within normal limits, no S3, no S4, no clicks, no rubs, *** murmurs ABD:  Flat, positive bowel sounds normal in frequency in pitch, no bruits, no rebound, no guarding, no midline pulsatile mass, no hepatomegaly, no splenomegaly EXT:  2 plus pulses throughout, no edema, no cyanosis no clubbing    ***GENERAL:  Well appearing NECK:  No jugular venous distention, waveform within normal limits, carotid upstroke brisk and symmetric, no bruits, no thyromegaly LUNGS:  Clear to auscultation  bilaterally CHEST:  Unremarkable HEART:  PMI not displaced or sustained,S1 and S2 within normal limits, no S3, no S4, no clicks, no rubs, no murmurs ABD:  Flat, positive bowel sounds normal in frequency in pitch, no bruits, no rebound, no guarding, no midline pulsatile mass, no hepatomegaly, no splenomegaly EXT:  2 plus pulses throughout, no edema, no cyanosis no clubbing   EKG:  EKG is *** ordered today. Sinus rhythm, rate ***, RSR prime V1 and V2, nonspecific ST-T wave changes.  Low voltage in the limb in chest leads seen no change from previous   Recent Labs: No results found for requested labs within last 365 days.    Lipid Panel No results found for: "CHOL", "TRIG", "HDL", "CHOLHDL", "VLDL", "LDLCALC", "LDLDIRECT"    Wt Readings from Last 3 Encounters:  11/28/20 235 lb (106.6 kg)  10/31/20 227 lb (103 kg)  11/06/19 207 lb (93.9 kg)      Other studies Reviewed: Additional studies/ records that were reviewed today include:   *** Review of the above records demonstrates:    NA   ASSESSMENT AND PLAN:  CHEST PAIN:    ***     She is no longer having any chest pain.  Her right arm discomfort is a tingling and numbness in her fingers that is not typical for angina.  She had a very low risk perfusion study less than a year ago.  No further work-up is suggested.  She needs primary risk reduction.   TOBACCO:   ***  We talked about the need to stop smoking.  She does not want any therapies for this.   HYPOTHYROID:   ***  This is being managed by her primary provider.  No change in therapy.  OBESITY:   ***    She has lost weight.  She gained some of that back and we again talked about carbohydrate restriction.    Current medicines are reviewed at length with the patient today.  The patient does not have concerns regarding medicines.  The following changes have been made:  ***  Labs/ tests ordered today include:    ***    No orders of the defined types were placed in this  encounter.    Disposition:   FU with me ***       Signed, Minus Breeding, MD  04/12/2022 7:43 PM    [facility]

## 2022-04-13 ENCOUNTER — Ambulatory Visit: Payer: Self-pay | Attending: Cardiology | Admitting: Cardiology

## 2022-04-13 DIAGNOSIS — R072 Precordial pain: Secondary | ICD-10-CM

## 2022-04-13 DIAGNOSIS — E039 Hypothyroidism, unspecified: Secondary | ICD-10-CM

## 2022-04-13 DIAGNOSIS — Z720 Tobacco use: Secondary | ICD-10-CM

## 2022-07-04 ENCOUNTER — Telehealth: Payer: Self-pay | Admitting: Cardiology

## 2022-07-04 NOTE — Telephone Encounter (Signed)
Spoke with pt, she said that after her colonoscopy she has been having palpitations and her heart is skipping. It is not happening right now. She was not able to come to her last appointment and wants to make a follow up. She does report when her heart rate gets really high she will get pressure in her chest. Follow up scheduled and encouraged her to keep the appointment, if she continues change appointments she will need new referral.

## 2022-07-04 NOTE — Telephone Encounter (Signed)
STAT if HR is under 50 or over 120 (normal HR is 60-100 beats per minute)  What is your heart rate? 126   Do you have a log of your heart rate readings (document readings)? 1/14 80, 104             Do you have any other symptoms? Patient states she has sweats but can't tell the difference if it's heart related or menopause.  She states it has gone as high at 138.

## 2022-07-11 ENCOUNTER — Ambulatory Visit: Payer: Self-pay | Admitting: Cardiology

## 2022-07-12 DIAGNOSIS — R002 Palpitations: Secondary | ICD-10-CM | POA: Insufficient documentation

## 2022-07-12 NOTE — Progress Notes (Deleted)
Cardiology Office Note   Date:  07/12/2022   ID:  Wanda Montgomery, DOB 1975/12/02, MRN [id number]  PCP:  Adaline Sill, NP  Cardiologist:   Minus Breeding, MD Referring:  Ezra Sites, FNP   No chief complaint on file.   History of Present Illness: Wanda Montgomery is a 47 y.o. female who was referred by Ezra Sites for evaluation of chest pain.    After the first visit I sent her for an echo which showed moderate LVH and a low normal EF.  POET (Plain Old Exercise Treadmill) demonstrated no ischemia or infarct.   She had a moderate exercise tolerance.  She had a follow-up perfusion study which was low risk.  There was a question of a small apical defect but this was thought to be artifact.   Follow up echo demonstrated preserved EF and mild LVH.  However, follow-up echo did not suggest LVH.  At a previous visit she was having some chest pain.  She was to have a coronary CTA.   However, she was not able to hold the cigarettes long enough to actually go through the CT procedure.  Therefore, we canceled this.  She called last week and she had palpitations at the time of a colonoscopy.  ***   ***  She returns now and she is not really describing any chest pressure, neck or arm discomfort.  She is not describing the left arm discomfort she was having.  She is currently having right arm discomfort.  She has some numbness and tingling down into her hand.  She is also having numbness and tingling in her legs and is getting her lumbar spine looked at.  She is continue to have a lot of stress at home.  She does get bit by her dog.  She has had to have limitations because of this.   Past Medical History:  Diagnosis Date   Asthma    Chronic back pain    Hypothyroid    Migraine headache     Past Surgical History:  Procedure Laterality Date   TUBAL LIGATION       Current Outpatient Medications  Medication Sig Dispense Refill   albuterol (PROVENTIL HFA;VENTOLIN HFA) 108  (90 Base) MCG/ACT inhaler INHALE 2 PUFFS PO Q 6 H PRN  1   cyclobenzaprine (FLEXERIL) 5 MG tablet Take by mouth.     dicyclomine (BENTYL) 10 MG capsule Take 1 capsule (10 mg total) by mouth 3 (three) times daily as needed for spasms. 90 capsule 3   gabapentin (NEURONTIN) 600 MG tablet gabapentin 600 mg tablet  Take 1 tablet 3 times a day by oral route.     levothyroxine (SYNTHROID) 100 MCG tablet levothyroxine 100 mcg tablet  Take 1 tablet every day by oral route.     oxyCODONE-acetaminophen (PERCOCET/ROXICET) 5-325 MG tablet Percocet 5 mg-325 mg tablet  Take 1 tablet twice a day by oral route as needed.     vitamin E 180 MG (400 UNITS) capsule vitamin E (dl, acetate) 180 mg (400 unit) capsule  Take by oral route.     No current facility-administered medications for this visit.    Allergies:   Amoxicillin    ROS:  Please see the history of present illness.   Otherwise, review of systems are positive for ***.   All other systems are reviewed and negative.    PHYSICAL EXAM: VS:  There were no vitals taken for this visit. , BMI There is no  height or weight on file to calculate BMI.  GENERAL:  Well appearing NECK:  No jugular venous distention, waveform within normal limits, carotid upstroke brisk and symmetric, no bruits, no thyromegaly LUNGS:  Clear to auscultation bilaterally CHEST:  Unremarkable HEART:  PMI not displaced or sustained,S1 and S2 within normal limits, no S3, no S4, no clicks, no rubs, *** murmurs ABD:  Flat, positive bowel sounds normal in frequency in pitch, no bruits, no rebound, no guarding, no midline pulsatile mass, no hepatomegaly, no splenomegaly EXT:  2 plus pulses throughout, no edema, no cyanosis no clubbing    ***GENERAL:  Well appearing NECK:  No jugular venous distention, waveform within normal limits, carotid upstroke brisk and symmetric, no bruits, no thyromegaly LUNGS:  Clear to auscultation bilaterally CHEST:  Unremarkable HEART:  PMI not displaced  or sustained,S1 and S2 within normal limits, no S3, no S4, no clicks, no rubs, no murmurs ABD:  Flat, positive bowel sounds normal in frequency in pitch, no bruits, no rebound, no guarding, no midline pulsatile mass, no hepatomegaly, no splenomegaly EXT:  2 plus pulses throughout, no edema, no cyanosis no clubbing   EKG:  EKG is *** ordered today. Sinus rhythm, rate ***, RSR prime V1 and V2, nonspecific ST-T wave changes.  Low voltage in the limb in chest leads seen no change from previous   Recent Labs: No results found for requested labs within last 365 days.    Lipid Panel No results found for: "CHOL", "TRIG", "HDL", "CHOLHDL", "VLDL", "LDLCALC", "LDLDIRECT"    Wt Readings from Last 3 Encounters:  11/28/20 235 lb (106.6 kg)  10/31/20 227 lb (103 kg)  11/06/19 207 lb (93.9 kg)      Other studies Reviewed: Additional studies/ records that were reviewed today include: ***. Review of the above records demonstrates:    NA   ASSESSMENT AND PLAN:  PALPITATIONS:  ***   TOBACCO:   *** We talked about the need to stop smoking.  She does not want any therapies for this.   HYPOTHYROID:   ***  This is being managed by her primary provider.  No change in therapy.   Current medicines are reviewed at length with the patient today.  The patient does not have concerns regarding medicines.  The following changes have been made:  ***  Labs/ tests ordered today include:    ***  No orders of the defined types were placed in this encounter.    Disposition:   FU with me ***.      Signed, Minus Breeding, MD  07/12/2022 8:01 PM    [facility]

## 2022-07-13 ENCOUNTER — Ambulatory Visit: Payer: Self-pay | Attending: Cardiology | Admitting: Cardiology

## 2022-07-13 DIAGNOSIS — Z720 Tobacco use: Secondary | ICD-10-CM

## 2022-07-13 DIAGNOSIS — R002 Palpitations: Secondary | ICD-10-CM

## 2022-08-22 IMAGING — DX DG LUMBAR SPINE COMPLETE 4+V
5 series · 5 of 5 positions shown · non-contrast
Comparison: March 31, 2018

CLINICAL DATA: Chronic low back pain with lower extremity numbness

EXAM:
LUMBAR SPINE - COMPLETE 4+ VIEW

[l-spine ap]
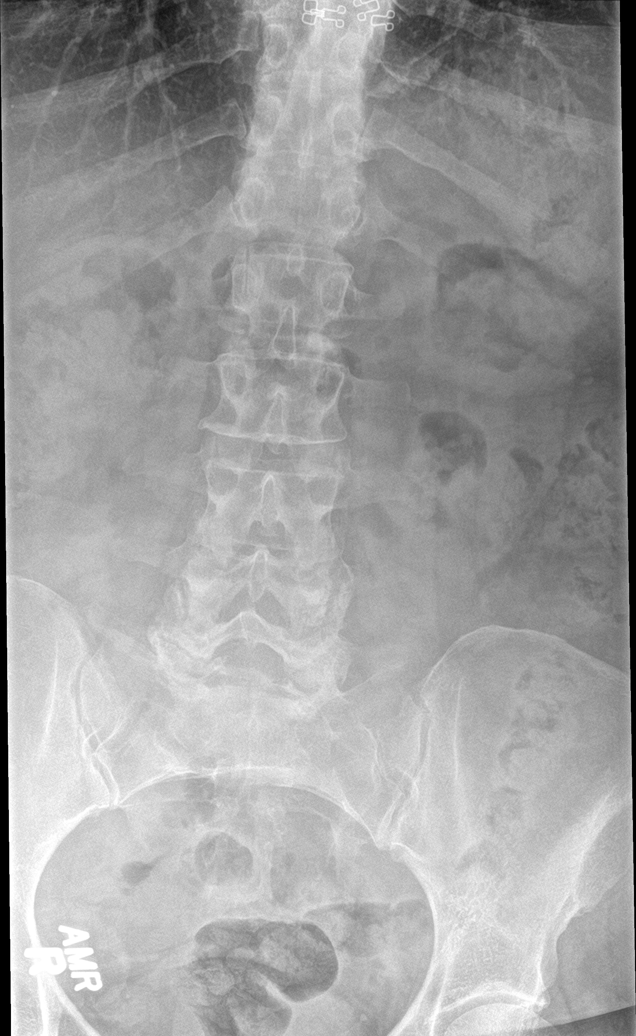

[l-spine obl (1 of 2)]
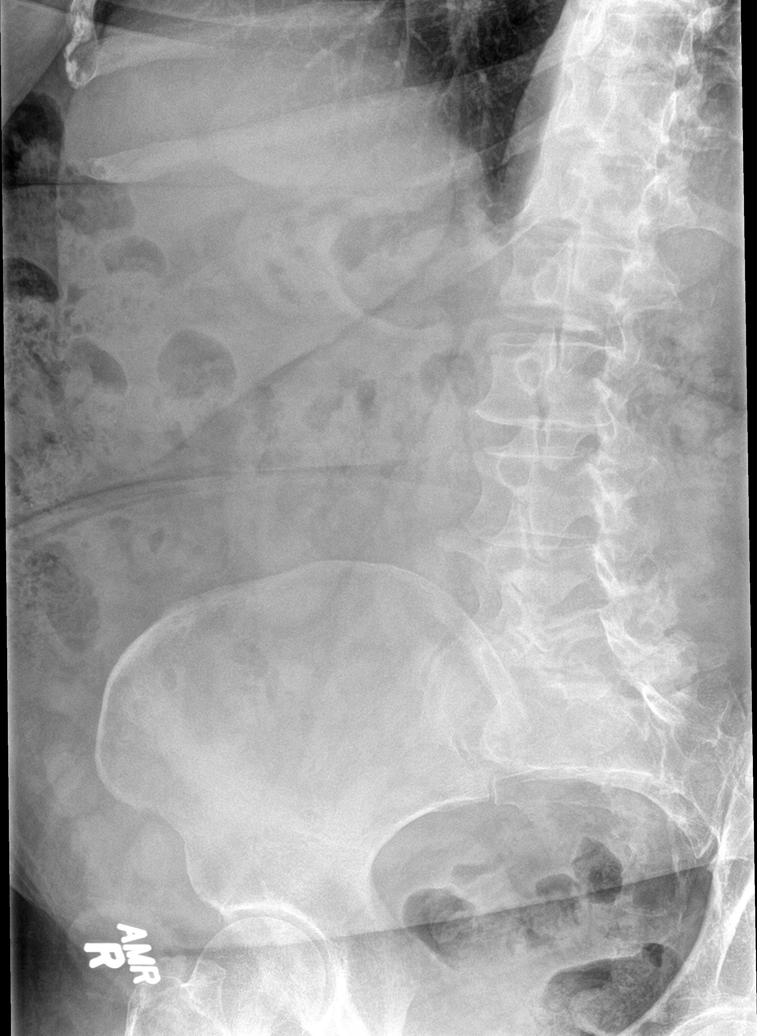

[l-spine obl (2 of 2)]
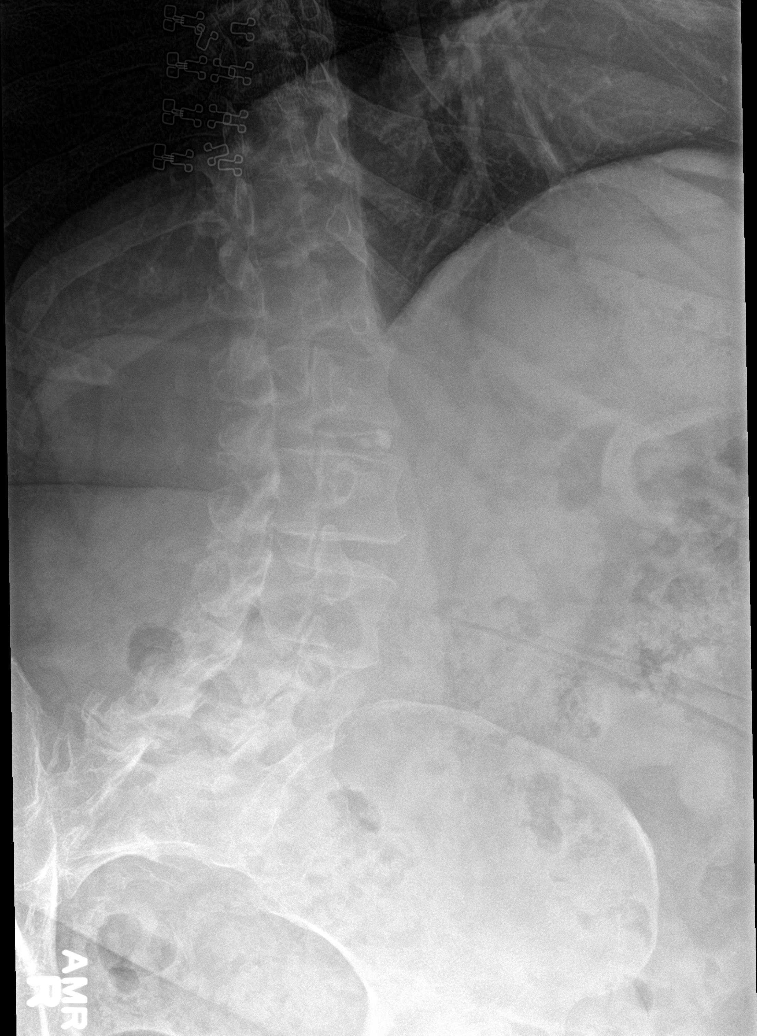

[l-spine lat]
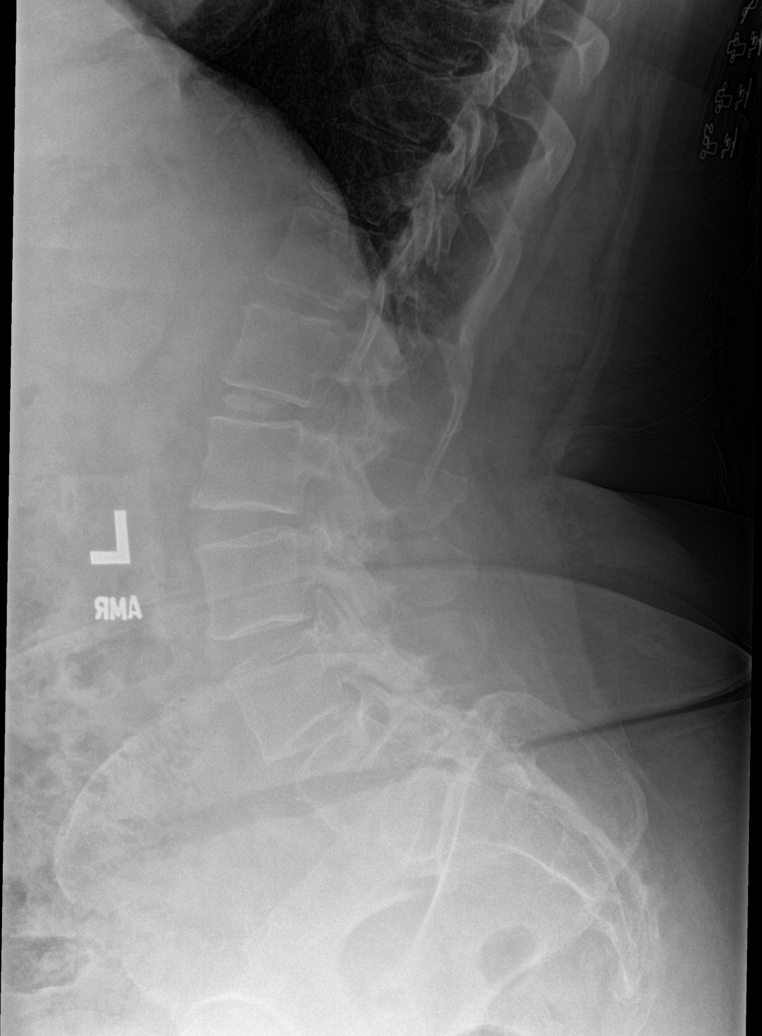

[l-spine spot]
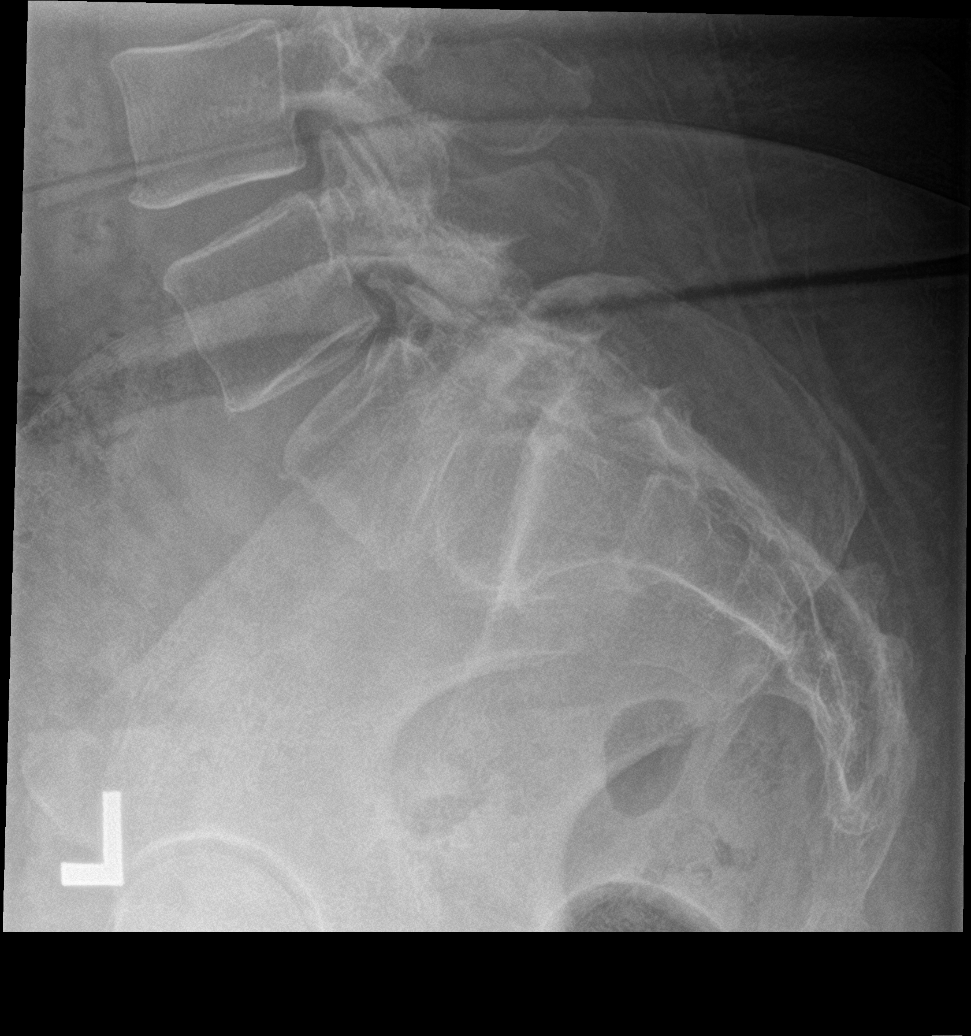

[5 of 5 positions shown; findings below may reference images not displayed]

FINDINGS: Frontal, lateral, spot lumbosacral lateral, and bilateral oblique
views were obtained. There are 5 non-rib-bearing lumbar type
vertebral bodies. There is no fracture or spondylolisthesis. There
is mild disc space narrowing at L5-S1. Disc spaces at other levels
appear unremarkable. There is facet osteoarthritic change at L4-5
and L5-S1 bilaterally.
IMPRESSION: Osteoarthritic change in the lower lumbar spine, with mild
progression of disc space narrowing at L5-S1 compared to prior
study. No fracture or spondylolisthesis.
